# Patient Record
Sex: Male | Born: 1980 | Race: White | Hispanic: No | Smoking: Former smoker
Health system: Southern US, Academic
[De-identification: ages and names within clinical notes are randomized; demographics above are authoritative.]

## PROBLEM LIST (undated history)

## (undated) ENCOUNTER — Encounter (HOSPITAL_BASED_OUTPATIENT_CLINIC_OR_DEPARTMENT_OTHER): Admission: RE | Payer: Self-pay | Source: Ambulatory Visit

## (undated) DIAGNOSIS — K219 Gastro-esophageal reflux disease without esophagitis: Secondary | ICD-10-CM

## (undated) HISTORY — PX: ABDOMINAL SURGERY: SHX537

## (undated) HISTORY — PX: LAPAROSCOPIC CHOLECYSTECTOMY: SUR755

## (undated) HISTORY — PX: HX HERNIA REPAIR: SHX51

## (undated) HISTORY — PX: URETERAL STENT PLACEMENT: SHX822

## (undated) HISTORY — PX: LITHOTRIPSY: SUR834

## (undated) SURGERY — PAIN SERVICE BLOCK INJECTION CERVICAL EPIDURAL WITH IMAGING
Anesthesia: Local (Nurse-Monitored)

---
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
## 2021-11-04 ENCOUNTER — Telehealth (INDEPENDENT_AMBULATORY_CARE_PROVIDER_SITE_OTHER): Payer: Self-pay | Admitting: OTOLARYNGOLOGY

## 2021-11-04 MED ORDER — QNASL 80 MCG/ACTUATION NASAL AEROSOL SPRAY
160.0000 ug | INHALATION_SPRAY | Freq: Every day | NASAL | 11 refills | Status: DC
Start: 2021-11-04 — End: 2022-10-25

## 2021-11-04 MED ORDER — AZELASTINE 137 MCG (0.1 %) NASAL SPRAY
2.0000 | Freq: Every day | NASAL | 11 refills | Status: DC
Start: 2021-11-04 — End: 2022-10-25

## 2021-11-04 MED ORDER — CETIRIZINE 10 MG TABLET
10.0000 mg | ORAL_TABLET | Freq: Every day | ORAL | 3 refills | Status: DC
Start: 2021-11-04 — End: 2022-10-25

## 2021-11-04 MED ORDER — MONTELUKAST 10 MG TABLET
10.0000 mg | ORAL_TABLET | Freq: Every day | ORAL | 3 refills | Status: DC
Start: 2021-11-04 — End: 2022-10-25

## 2021-11-04 NOTE — Telephone Encounter (Signed)
Pt requesting refills for allergy medication

## 2021-11-22 ENCOUNTER — Encounter (INDEPENDENT_AMBULATORY_CARE_PROVIDER_SITE_OTHER): Payer: Self-pay | Admitting: OTOLARYNGOLOGY

## 2021-12-08 ENCOUNTER — Inpatient Hospital Stay (HOSPITAL_COMMUNITY): Payer: MEDICAID

## 2021-12-08 ENCOUNTER — Encounter (HOSPITAL_COMMUNITY): Payer: Self-pay | Admitting: Family

## 2021-12-08 ENCOUNTER — Emergency Department (HOSPITAL_COMMUNITY): Payer: MEDICAID

## 2021-12-08 ENCOUNTER — Emergency Department
Admission: EM | Admit: 2021-12-08 | Discharge: 2021-12-08 | Disposition: A | Discharge status: Home / Self Care | Payer: MEDICAID | Attending: Family | Admitting: Family

## 2021-12-08 ENCOUNTER — Other Ambulatory Visit: Payer: Self-pay

## 2021-12-08 DIAGNOSIS — R52 Pain, unspecified: Secondary | ICD-10-CM

## 2021-12-08 DIAGNOSIS — Z6825 Body mass index (BMI) 25.0-25.9, adult: Secondary | ICD-10-CM

## 2021-12-08 DIAGNOSIS — M546 Pain in thoracic spine: Secondary | ICD-10-CM | POA: Insufficient documentation

## 2021-12-08 DIAGNOSIS — S40211A Abrasion of right shoulder, initial encounter: Secondary | ICD-10-CM | POA: Insufficient documentation

## 2021-12-08 DIAGNOSIS — S1091XA Abrasion of unspecified part of neck, initial encounter: Secondary | ICD-10-CM | POA: Insufficient documentation

## 2021-12-08 DIAGNOSIS — W208XXA Other cause of strike by thrown, projected or falling object, initial encounter: Secondary | ICD-10-CM | POA: Insufficient documentation

## 2021-12-08 DIAGNOSIS — T148XXA Other injury of unspecified body region, initial encounter: Secondary | ICD-10-CM

## 2021-12-08 DIAGNOSIS — M542 Cervicalgia: Secondary | ICD-10-CM | POA: Insufficient documentation

## 2021-12-08 DIAGNOSIS — G44319 Acute post-traumatic headache, not intractable: Secondary | ICD-10-CM | POA: Insufficient documentation

## 2021-12-08 DIAGNOSIS — M19022 Primary osteoarthritis, left elbow: Secondary | ICD-10-CM | POA: Insufficient documentation

## 2021-12-08 MED ORDER — KETOROLAC 60 MG/2 ML INTRAMUSCULAR SOLUTION
INTRAMUSCULAR | Status: AC
Start: 2021-12-08 — End: 2021-12-08
  Filled 2021-12-08: qty 2

## 2021-12-08 MED ORDER — METHOCARBAMOL 750 MG TABLET
750.0000 mg | ORAL_TABLET | Freq: Once | ORAL | Status: AC
Start: 2021-12-08 — End: 2021-12-08
  Administered 2021-12-08: 750 mg via ORAL

## 2021-12-08 MED ORDER — METHOCARBAMOL 750 MG TABLET
ORAL_TABLET | ORAL | Status: AC
Start: 2021-12-08 — End: 2021-12-08
  Filled 2021-12-08: qty 1

## 2021-12-08 MED ORDER — IBUPROFEN 800 MG TABLET
800.0000 mg | ORAL_TABLET | Freq: Three times a day (TID) | ORAL | 0 refills | Status: AC | PRN
Start: 2021-12-08 — End: 2021-12-13

## 2021-12-08 MED ORDER — KETOROLAC 60 MG/2 ML INTRAMUSCULAR SOLUTION
60.0000 mg | INTRAMUSCULAR | Status: AC
Start: 2021-12-08 — End: 2021-12-08
  Administered 2021-12-08: 60 mg via INTRAMUSCULAR

## 2021-12-08 MED ORDER — CYCLOBENZAPRINE 10 MG TABLET
10.0000 mg | ORAL_TABLET | Freq: Three times a day (TID) | ORAL | 0 refills | Status: AC | PRN
Start: 2021-12-08 — End: 2021-12-13

## 2021-12-08 NOTE — ED Provider Notes (Signed)
[place] [facility]  ED Primary Provider Note  History of Present Illness   Chief Complaint   Patient presents with    Medical Screening     Arrival: The patient arrived by Car    [name] is a 41 y.o. male who had concerns including Medical Screening.  Patient presents to the emergency room for evaluation of pain related to injury while at [facility] prior to arrival.  Patient states he was at the lumbar section and boards fell from the shelf landing on him.  States that multiple 2 x 4 by 10s fell on him, staff from [employer] had to get the boards off of him.  Patient complains of headache, neck pain, back pain, wrist pain, pain.  He states his pain is currently a 10/10.  He states his vision was blurred when initial injury occurred.  He denies nausea, vomiting, numbness, and tingling.    Review of Systems   All other systems reviewed and are negative except as noted.    Historical Data   History Reviewed This Encounter: Medical History  Surgical History  Family History  Social History    Physical Exam   ED Triage Vitals [12/08/21 1315]   BP (Non-Invasive) (!) 161/104   Heart Rate 88   Respiratory Rate 16   Temperature 36.6 C (97.8 F)   SpO2 100 %   Weight 81.6 kg (180 lb)   Height 1.778 m (5\' 10" )       Constitutional:  41 y.o. male who appears in no distress. Normal color, no cyanosis.   HENT:   Head: Normocephalic and atraumatic.   Mouth/Throat: Oropharynx is clear and moist.   Eyes: EOMI, PERRL   Neck: Trachea midline. Neck supple.  Cardiovascular: RRR, S1, S2.  No murmurs, rubs or gallops. Intact distal pulses.  Pulmonary/Chest: BS equal bilaterally. No respiratory distress. No wheezes, rales or chest tenderness.   Abdominal: Bowel sounds present and normal. Abdomen soft, no tenderness, no rebound and no guarding.  Back: No midline spinal tenderness, no paraspinal tenderness, no CVA tenderness.           Musculoskeletal: No edema, tenderness or deformity.  Skin: warm and dry. No  rash, erythema, pallor or cyanosis.  Superficial scratch noted to the left neck 2 cm, abrasion noted to the right scapular area, 3 cm in diameter and superficial.  Black contusion 1 cm in diameter noted to the medial aspect of the right bicep.  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   Psychiatric: normal mood and affect. Behavior is normal.   Neurological: Patient keenly alert and responsive, easily able to raise eyebrows, facial muscles/expressions symmetric, speaking in fluent sentences, moving all extremities equally and fully, normal gait  Patient Data   Labs Ordered/Reviewed - No data to display  XR THORACIC SPINE 2 VIEW   Final Result by Edi, Radresults In (07/26 1442)   NEGATIVE THORACIC SPINE X-RAYS.            Radiologist location ID: [id number]         CT CERVICAL SPINE WO IV CONTRAST   Final Result by Edi, Radresults In (07/26 1359)   NO ACUTE CERVICAL FRACTURE         One or more dose reduction techniques were used (e.g., Automated exposure control, adjustment of the mA and/or kV according to patient size, use of iterative reconstruction technique).         Radiologist location ID: [id number]  CT BRAIN WO IV CONTRAST   Final Result by Edi, Radresults In (07/26 1358)   NO ACUTE FINDINGS         One or more dose reduction techniques were used (e.g., Automated exposure control, adjustment of the mA and/or kV according to patient size, use of iterative reconstruction technique).         Radiologist location ID: [id number]         XR HAND LEFT   Final Result by Edi, Radresults In (07/26 1341)   NO ACUTE FRACTURE OR DISLOCATION.                      Radiologist location ID: [id number]         XR ELBOW LEFT   Final Result by Edi, Radresults In (07/26 1348)   MILD DEGENERATIVE ARTHROSIS. NO ACUTE FINDINGS.                Radiologist location ID: [id number]         XR HIPS BILATERAL W PELVIS 3-4 VIEWS   Final Result by Edi, Radresults In (07/26 1338)   NEGATIVE BILATERAL HIP SERIES.                 Radiologist location ID: [id number]           Medical Decision Making        Medical Decision Making  Will discharge patient home due to no significant/urgent findings requiring admission.  Patient has no fractures noted on x-rays/CT.  CT of the brain shows no bleeding.  Neurologically the patient is intact.  He has mild abrasions noted to the scapula as well as a scratch to the neck.  Patient was giving ketorolac as well as a muscle relaxer here in the ER.  We will place him on muscle relaxers outpatient.  Patient was instructed to use ice the 1st 24-48 hours 5 to 6 times a day for 15-20 minutes.  He is to alternate heat and ice after that time.  Patient to follow up with his regular doctor in 2-3 days and return to the ER for any problems or worsening.  Significant other at bedside, she was notified to monitor for changes in mental status, uneven pupils, numbness or tingling and to return to the ER if needed.  Both verbalize understanding.  He has remained alert and oriented in the ER.    Amount and/or Complexity of Data Reviewed  Labs: ordered. Decision-making details documented in ED Course.  Radiology: ordered. Decision-making details documented in ED Course.    Risk  Prescription drug management.    Critical Care  Total time providing critical care: 0 minutes           Medications Administered in the ED   ketorolac (TORADOL) 60mg /2 mL IM injection (60 mg IntraMUSCULAR Given 12/08/21 1437)   methocarbamol (ROBAXIN) tablet (750 mg Oral Given 12/08/21 1437)     Clinical Impression   Acute post-traumatic headache, not intractable (Primary)   Neck pain   Acute bilateral thoracic back pain   Generalized pain   Abrasion of right shoulder   Scratch mark - left neck       Disposition: Discharged    Current Discharge Medication List        START taking these medications    Details   cyclobenzaprine (FLEXERIL) 10 mg Oral Tablet Take 1 Tablet (10 mg total) by mouth Three times a day as needed for Muscle spasms for up to  5  days  Qty: 15 Tablet, Refills: 0      Ibuprofen (MOTRIN) 800 mg Oral Tablet Take 1 Tablet (800 mg total) by mouth Three times a day as needed for Pain for up to 5 days  Qty: 15 Tablet, Refills: 0

## 2021-12-08 NOTE — ED Nurses Note (Signed)
Patient discharged home with family.  AVS reviewed with patient.  A written copy of the AVS and discharge instructions was given to the patient.  Questions sufficiently answered as needed.  Patient encouraged to follow up with PCP as indicated.  In the event of an emergency, patient instructed to call 911 or go to the nearest emergency room.

## 2021-12-08 NOTE — Discharge Instructions (Signed)
FOLLOW UP WITH THE PRIMARY CARE PROVIDER AS SOON AS POSSIBLE BUT NO LATER THAN 3 DAYS NOW.  IF NO PRIMARY CARE PROVIDER EXISTS, THEN THE PATIENT IS INSTRUCTED TO ESTABLISH CARE WITH A PRIMARY CARE PROVIDER. FOLLOW-UP WITH ANY SPECIALIST PROVIDER AS INDICATED AS SOON AS POSSIBLE BUT NO LATER THAN 3 DAYS.  NOTIFY THE PRIMARY CARE PROVIDER THAT YOU WERE IN THE EMERGENCY DEPARTMENT WITHIN 24 HOURS OF DISCHARGE TO FOLLOW-UP ON YOUR RESULTS AND/OR TREATMENTS.  RETURN TO THE EMERGENCY DEPARTMENT IMMEDIATELY IF NEEDED, NO BETTER, WORSE, NEW SYMPTOMS ARISE, OR YOU CANNOT FOLLOW-UP WITH YOUR PRIMARY CARE PROVIDER IN THE PRESCRIBED TIMEFRAME.

## 2021-12-08 NOTE — ED Triage Notes (Signed)
STATES HAD LUMBER FALL ON HIM AT [employer]. NEG LOC. C/O HA, L ELBOW, L KNEE, AND BACK PAIN. NO OBVIOUS INJURY.

## 2021-12-08 NOTE — ED APP Handoff Note (Signed)
[place] [facility]  Emergency Department  Provider in Triage Note    Name: [name]  Age: 41 y.o.  Gender: male     Subjective:   [name] is a 41 y.o. male who presents with complaint of Medical Screening  .  Pt had lumbar fall on him at [facility]. He reports headache, left elbow pain. This happened around an hour PTA    Objective:   Filed Vitals:    12/08/21 1315   BP: (!) 161/104   Pulse: 88   Resp: 16   Temp: 36.6 C (97.8 F)   SpO2: 100%      Focused Physical Exam shows wnwd male pt.     Assessment:  A medical screening exam was completed.  This patient is a 41 y.o. male with initial findings showing left elbow pain.    Plan:  Please see initial orders and work-up below.  This is to be continued with full evaluation in the main Emergency Department.     No current facility-administered medications for this encounter.     No results found for this or any previous visit (from the past 24 hour(s)).     [place], PA-C  12/08/2021, 13:13

## 2021-12-09 ENCOUNTER — Emergency Department (HOSPITAL_COMMUNITY): Payer: MEDICAID

## 2021-12-09 ENCOUNTER — Emergency Department
Admission: EM | Admit: 2021-12-09 | Discharge: 2021-12-09 | Disposition: A | Discharge status: Home / Self Care | Payer: MEDICAID | Attending: Family | Admitting: Family

## 2021-12-09 ENCOUNTER — Encounter (HOSPITAL_COMMUNITY): Payer: Self-pay | Admitting: Family

## 2021-12-09 ENCOUNTER — Other Ambulatory Visit: Payer: Self-pay

## 2021-12-09 DIAGNOSIS — X58XXXA Exposure to other specified factors, initial encounter: Secondary | ICD-10-CM | POA: Insufficient documentation

## 2021-12-09 DIAGNOSIS — Z6825 Body mass index (BMI) 25.0-25.9, adult: Secondary | ICD-10-CM

## 2021-12-09 DIAGNOSIS — R519 Headache, unspecified: Secondary | ICD-10-CM | POA: Insufficient documentation

## 2021-12-09 DIAGNOSIS — S161XXA Strain of muscle, fascia and tendon at neck level, initial encounter: Secondary | ICD-10-CM | POA: Insufficient documentation

## 2021-12-09 HISTORY — DX: Gastro-esophageal reflux disease without esophagitis: K21.9

## 2021-12-09 MED ORDER — ONDANSETRON 4 MG DISINTEGRATING TABLET
4.0000 mg | ORAL_TABLET | ORAL | Status: AC
Start: 2021-12-09 — End: 2021-12-09
  Administered 2021-12-09: 4 mg via ORAL

## 2021-12-09 MED ORDER — KETOROLAC 10 MG TABLET
10.0000 mg | ORAL_TABLET | Freq: Four times a day (QID) | ORAL | 0 refills | Status: AC | PRN
Start: 2021-12-09 — End: 2021-12-19

## 2021-12-09 MED ORDER — ONDANSETRON 4 MG DISINTEGRATING TABLET
ORAL_TABLET | ORAL | Status: AC
Start: 2021-12-09 — End: 2021-12-09
  Filled 2021-12-09: qty 1

## 2021-12-09 MED ORDER — ONDANSETRON 4 MG DISINTEGRATING TABLET
4.0000 mg | ORAL_TABLET | Freq: Three times a day (TID) | ORAL | 0 refills | Status: AC | PRN
Start: 2021-12-09 — End: 2021-12-13

## 2021-12-09 MED ORDER — KETOROLAC 60 MG/2 ML INTRAMUSCULAR SOLUTION
INTRAMUSCULAR | Status: AC
Start: 2021-12-09 — End: 2021-12-09
  Filled 2021-12-09: qty 2

## 2021-12-09 MED ORDER — KETOROLAC 60 MG/2 ML INTRAMUSCULAR SOLUTION
60.0000 mg | INTRAMUSCULAR | Status: AC
Start: 2021-12-09 — End: 2021-12-09
  Administered 2021-12-09: 60 mg via INTRAMUSCULAR

## 2021-12-09 NOTE — ED APP Handoff Note (Signed)
[place] [facility]  Emergency Department  Handoff Note    Care/report received from Juanda Crumble NP @ [phone]  Per report:  [name] is a 41 y.o. male who had concerns including Headache and Neck Pain.     Pending labs/imaging/consults:  CT of head  Plan:  CT of head negative recommended symptomatic management follow up with primary care    Course:      Following the history, physical exam, and ED workup, the patient was deemed stable and suitable for discharge. The patient/caregiver was advised to return to the ED for any new or worsening symptoms. Discharge medications, and follow-up instructions were discussed with the patient/caregiver in detail, who verbalizes understanding. The patient/caregiver is in agreement and is comfortable with the plan of care.    Disposition: Discharged         Current Discharge Medication List        CONTINUE these medications - NO CHANGES were made during your visit.        Details   azelastine 137 mcg (0.1 %) Aerosol, Spray  Commonly known as: ASTELIN   2 Sprays, Each Nostril, DAILY, Use in each nostril as directed  Qty: 30 mL  Refills: 11     cetirizine 10 mg Tablet  Commonly known as: ZyrTEC   10 mg, Oral, DAILY  Qty: 90 Tablet  Refills: 3     cyclobenzaprine 10 mg Tablet  Commonly known as: FLEXERIL   10 mg, Oral, 3 TIMES DAILY PRN  Qty: 15 Tablet  Refills: 0     Ibuprofen 800 mg Tablet  Commonly known as: MOTRIN   800 mg, Oral, 3 TIMES DAILY PRN  Qty: 15 Tablet  Refills: 0     montelukast 10 mg Tablet  Commonly known as: SINGULAIR   10 mg, Oral, DAILY  Qty: 90 Tablet  Refills: 3     QNASL 80 mcg/actuation HFA Aerosol Inhaler  Generic drug: beclomethasone dipropionate   160 mcg, Nasal, DAILY  Qty: 10.6 g  Refills: 11            Follow up:   No follow-up provider specified.    Clinical Impression   Headache (Primary)   Cervical strain         [facility], PA-C

## 2021-12-09 NOTE — ED Nurses Note (Addendum)
Patient to ER 6 c/o a headache and neck pain after having lumber fall on top of him yesterday at [employer]. Patient was here in the ER yesterday as well. Patient reports that the headache has got worse since yesterday. Also, reports nausea. Patient is awake, A&O x 4. NAD noted. Call light within reach. Will continue to monitor.

## 2021-12-09 NOTE — ED Provider Notes (Signed)
[place] [facility]  ED Primary Provider Note  History of Present Illness   Chief Complaint   Patient presents with    Headache    Neck Pain     Arrival: The patient arrived by Car    [name] is a 41 y.o. male who had concerns including Headache and Neck Pain.  41 year old male presents emergency room for worsening headache, he states it feels like the worse sinus headache of his life.  Patient states he has some nausea.  He also notes ongoing neck discomfort.  Patient denies numbness, tingling, loss of consciousness.  Patient was seen on 12/08/2021 after he stated lumbar fell from the shell landing on him while at [facility].  Patient did have generalized pain and now it feels he has tenderness to his scalp area and continues to have some neck and back pain.  He states he did take the Flexeril but was not able to take them today.  He states he did take ibuprofen and attempted to work today but the pain has continued and is worsening in the head.    Review of Systems   All other systems reviewed and are negative except as noted.    Historical Data   History Reviewed This Encounter: Medical History  Surgical History  Family History  Social History    Physical Exam   ED Triage Vitals [12/09/21 1541]   BP (Non-Invasive) (!) 164/97   Pulse    Respiratory Rate 18   Temperature 36.6 C (97.8 F)   SpO2 98 %   Weight 81.6 kg (180 lb)   Height 1.778 m (5\' 10" )       Constitutional:  41 y.o. male who appears in no distress. Normal color, no cyanosis.   HENT:   Head: Normocephalic and atraumatic. Tenderness noted to scalp.  Mouth/Throat: Oropharynx is clear and moist.   Eyes: EOMI, PERRL   Neck: Trachea midline. Neck supple.  Cardiovascular: RRR, S1, S2. No murmurs, rubs or gallops. Intact distal pulses.  Pulmonary/Chest: BS clear and equal bilaterally. No respiratory distress. No wheezes, rales or chest tenderness.   Abdominal: Bowel sounds present and normal. Abdomen soft, no tenderness, no  rebound and no guarding.  Back: No midline spinal tenderness, no paraspinal tenderness, no CVA tenderness.           Musculoskeletal: No edema, tenderness or deformity. Tenderness noted generalized back.   Skin: warm and dry. No rash, erythema, pallor or cyanosis. ? Contusion noted mid scapular area blue in color.   Psychiatric: normal mood and affect. Behavior is normal.   Neurological: Patient keenly alert and responsive, easily able to raise eyebrows, facial muscles/expressions symmetric, speaking in fluent sentences, moving all extremities equally and fully, normal gait  Patient Data   Labs Ordered/Reviewed - No data to display  No orders to display     Medical Decision Making        Medical Decision Making  Pt care transferred to [facility] pending Ct head.     Amount and/or Complexity of Data Reviewed  Radiology: ordered.    Risk  Prescription drug management.             Medications Administered in the ED   ketorolac (TORADOL) 60mg /2 mL IM injection (60 mg IntraMUSCULAR Given 12/09/21 1616)   ondansetron (ZOFRAN ODT) rapid dissolve tablet (4 mg Oral Given 12/09/21 1616)     Clinical Impression   Headache (Primary)       Disposition:  Data Unavailable

## 2021-12-09 NOTE — Discharge Instructions (Signed)
CT of the head does not demonstrate any bleeds.  Recommend rest pushing fluids taking muscle relaxers as needed.  Apply warm compresses to the neck with gentle stretching.  Follow up with the primary care to make sure your getting better.  If new or worsening symptoms return to the emergency department

## 2021-12-09 NOTE — ED Triage Notes (Addendum)
Complaining of headache and posterior neck pain.  Was seen and evaluated in ER yesterday after lumber fell on him at a local store.  Reports worsening headache.  Nausea unrelieved by Zofran at home.

## 2021-12-09 NOTE — ED Nurses Note (Signed)
Nurse in to discharge patient. Patient states day shift provider was "going to send me home with Toradol". Pt did not at this time have prescription. States he "knew this would happen". This nurse offered to speak with night shift provider. Pt states "I don't have time for that." Patient request to speak with nursing supervisor or charge nurse. Charge nurse in to speak with patient. This nurse made provider aware.

## 2022-01-23 ENCOUNTER — Other Ambulatory Visit (INDEPENDENT_AMBULATORY_CARE_PROVIDER_SITE_OTHER): Payer: Self-pay | Admitting: Family

## 2022-01-24 NOTE — Telephone Encounter (Signed)
Needs appt before any further refills

## 2022-04-05 ENCOUNTER — Other Ambulatory Visit (HOSPITAL_COMMUNITY): Payer: Self-pay | Admitting: Family

## 2022-04-05 ENCOUNTER — Inpatient Hospital Stay
Admission: RE | Admit: 2022-04-05 | Discharge: 2022-04-05 | Disposition: A | Discharge status: Home / Self Care | Payer: MEDICAID | Source: Ambulatory Visit | Attending: Family | Admitting: Family

## 2022-04-05 ENCOUNTER — Other Ambulatory Visit: Payer: Self-pay

## 2022-04-05 DIAGNOSIS — R079 Chest pain, unspecified: Secondary | ICD-10-CM | POA: Insufficient documentation

## 2022-04-05 DIAGNOSIS — T1490XA Injury, unspecified, initial encounter: Secondary | ICD-10-CM

## 2022-05-27 ENCOUNTER — Other Ambulatory Visit: Payer: Self-pay

## 2022-05-27 ENCOUNTER — Emergency Department (HOSPITAL_COMMUNITY): Payer: MEDICAID

## 2022-05-27 ENCOUNTER — Encounter (HOSPITAL_COMMUNITY): Payer: Self-pay

## 2022-05-27 ENCOUNTER — Emergency Department
Admission: EM | Admit: 2022-05-27 | Discharge: 2022-05-27 | Disposition: A | Discharge status: Home / Self Care | Payer: MEDICAID | Source: Home / Self Care | Attending: Emergency Medicine | Admitting: Emergency Medicine

## 2022-05-27 DIAGNOSIS — S060XAA Concussion with loss of consciousness status unknown, initial encounter: Secondary | ICD-10-CM | POA: Insufficient documentation

## 2022-05-27 DIAGNOSIS — S060X1A Concussion with loss of consciousness of 30 minutes or less, initial encounter: Secondary | ICD-10-CM

## 2022-05-27 DIAGNOSIS — W11XXXA Fall on and from ladder, initial encounter: Secondary | ICD-10-CM | POA: Insufficient documentation

## 2022-05-27 MED ORDER — DIPHENHYDRAMINE 50 MG/ML INJECTION SOLUTION
INTRAMUSCULAR | Status: AC
Start: 2022-05-27 — End: 2022-05-27
  Filled 2022-05-27: qty 1

## 2022-05-27 MED ORDER — DEXAMETHASONE SODIUM PHOSPHATE (PF) 10 MG/ML INJECTION SOLUTION
INTRAMUSCULAR | Status: AC
Start: 2022-05-27 — End: 2022-05-27
  Filled 2022-05-27: qty 1

## 2022-05-27 MED ORDER — ACETAMINOPHEN 325 MG TABLET
975.0000 mg | ORAL_TABLET | ORAL | Status: AC
Start: 2022-05-27 — End: 2022-05-27
  Administered 2022-05-27: 975 mg via ORAL

## 2022-05-27 MED ORDER — DIPHENHYDRAMINE 50 MG/ML INJECTION SOLUTION
12.5000 mg | INTRAMUSCULAR | Status: AC
Start: 2022-05-27 — End: 2022-05-27
  Administered 2022-05-27: 12.5 mg via INTRAVENOUS

## 2022-05-27 MED ORDER — PROCHLORPERAZINE EDISYLATE 10 MG/2 ML (5 MG/ML) INJECTION SOLUTION
INTRAMUSCULAR | Status: AC
Start: 2022-05-27 — End: 2022-05-27
  Filled 2022-05-27: qty 2

## 2022-05-27 MED ORDER — NAPROXEN 500 MG TABLET
500.0000 mg | ORAL_TABLET | Freq: Two times a day (BID) | ORAL | 0 refills | Status: DC | PRN
Start: 2022-05-27 — End: 2022-10-25

## 2022-05-27 MED ORDER — ACETAMINOPHEN 325 MG TABLET
ORAL_TABLET | ORAL | Status: AC
Start: 2022-05-27 — End: 2022-05-27
  Filled 2022-05-27: qty 3

## 2022-05-27 MED ORDER — ONDANSETRON 4 MG DISINTEGRATING TABLET
4.0000 mg | ORAL_TABLET | Freq: Three times a day (TID) | ORAL | 0 refills | Status: AC | PRN
Start: 2022-05-27 — End: ?

## 2022-05-27 MED ORDER — DEXAMETHASONE SODIUM PHOSPHATE (PF) 10 MG/ML INJECTION SOLUTION
10.0000 mg | INTRAMUSCULAR | Status: AC
Start: 2022-05-27 — End: 2022-05-27
  Administered 2022-05-27: 10 mg via INTRAVENOUS

## 2022-05-27 MED ORDER — PROCHLORPERAZINE EDISYLATE 10 MG/2 ML (5 MG/ML) INJECTION SOLUTION
10.0000 mg | INTRAMUSCULAR | Status: AC
Start: 2022-05-27 — End: 2022-05-27
  Administered 2022-05-27: 10 mg via INTRAVENOUS

## 2022-05-27 NOTE — Discharge Instructions (Addendum)
Thank you for allowing [place] to be part of your care.    Please discuss all medications with your pharmacist to ensure there are no concerns of interactions.    Please ensure all questions or concerns are addressed prior to leaving the hospital. We want to make sure your concerns are addressed to make sure you are as safe and healthy as possible. By leaving the hospital, it is understood you are in agreement with your treatment plan.    You may have received sedating medication during your visit. Please discuss this with your discharging provider nurse as you may not be able to operate machines while the medication is in your system, or while you are taking any potentially sedating prescriptions.    Please call the hospital medical records office for a copy of your finalized results, and review them with a primary care physician, for any findings needing further attention.    If you feel your situation worsens, or does not get better in 48 hours, please see a physician for evaluation.    We encourage you to see your regular doctor as soon as possible to let them know you were seen in the emergency department. They may want to do further testing. If you do not have a doctor, please feel free to call the hospital, and ask for contact information of accepting providers. Please also discuss your vaccinations, and ensure all are up to date.    You may use this document to take today off work or school.    Please participate in brain rest. This includes no electronic devices, no school/work until cleared by your primary provider. You may return to work/school/activity once cleared by your primary provider or neurologist. Please discuss over the counter medication with your pharmacist.    Do not use other NSAID'S while taking Naproxen. Please discuss this with your pharmacist.

## 2022-05-27 NOTE — ED Provider Notes (Signed)
[facility]  ED Primary Provider Note  Patient Name: [name]  Patient Age: 42 y.o.  Date of Birth: [birth year]    Chief Complaint: Fall        History of Present Illness       [name] is a 42 y.o. male who had concerns including Fall.  This patient is a 41 year old male who presents after fall 2 days prior.  [name] states he was on 8 ft ladder, fell backwards, struck the occiput of his head, had loss of consciousness for proximally 3 minutes.  He presents today for continual headache, that is described as a sinus headache in nature.        Review of Systems     No other overt Review of Systems are noted to be positive except noted in the HPI.      Historical Data   History Reviewed This Encounter: Medical History  Surgical History  Family History  Social History      Physical Exam   ED Triage Vitals [05/27/22 0809]   BP (Non-Invasive) (!) 148/93   Heart Rate 72   Respiratory Rate 18   Temperature 36.7 C (98.1 F)   SpO2 100 %   Weight 81.6 kg (180 lb)   Height 1.778 m (5\' 10" )         Nursing notes reviewed for what could be assessed. Past Medical, Surgical, and Social history reviewed for what has been completed.    Constitutional: NAD. Well-Developed. Well Nourished.  Head: Normocephalic, atraumatic.  Mouth/Throat:  Symmetric facial movement, uvula midline.  Eyes: EOM grossly intact, conjunctiva normal.  Neck: Supple  Cardiovascular: Regular Rate and Rhythm, extremities well perfused.  Pulmonary/Chest: No respiratory distress. Lungs are symmetric to auscultation bilaterally.  Abdominal: Soft, non-tender, non-distended. Non peritoneal, no rebound, no guarding.  MSK: No Lower Extremity Edema.  Skin: Warm, dry, and intact  Neuro: Appropriate, CN II-XII grossly intact. Gait not ataxic.  No nystagmus on extraocular motion testing. PERRLA.  Psych: Pleasant              Procedures      Patient Data   Labs Ordered/Reviewed - No data to display    CT BRAIN WO IV  CONTRAST   Final Result by Edi, Radresults In (01/12 0853)   NORMAL NONCONTRAST HEAD CT.         One or more dose reduction techniques were used (e.g., Automated exposure control, adjustment of the mA and/or kV according to patient size, use of iterative reconstruction technique).         Radiologist location ID: [id number]         CT CERVICAL SPINE WO IV CONTRAST   Final Result by Edi, Radresults In (01/12 0852)   NO ACUTE CERVICAL FRACTURE         One or more dose reduction techniques were used (e.g., Automated exposure control, adjustment of the mA and/or kV according to patient size, use of iterative reconstruction technique).         Radiologist location ID: [place] Decision Making          Medical Decision Making        Studies Assessed and/or Ordered:  Radiology          MDM Narrative:  This patient is a 42 year old male who presents with a continual headache after falling 8 ft in days prior.  Differential includes intracranial hemorrhage, skull fracture, concussion.  Patient underwent CT imaging with no red flag findings of acute pathology.  Patient was given medication to assist with his headache.  Patient has no acute neurological deficits aside from his complaint of headache.  Return precautions given.             Medications Administered in the ED   prochlorperazine (COMPAZINE) 5 mg/mL injection (has no administration in time range)     And   diphenhydrAMINE (BENADRYL) 50 mg/mL injection (has no administration in time range)   acetaminophen (TYLENOL) tablet (has no administration in time range)   dexAMETHasone (PF) 10 mg/mL injection (has no administration in time range)       Following the history, physical exam, and ED workup, the patient was deemed stable and suitable for discharge. The patient/caregiver was advised to return to the ED for any new or worsening symptoms. Discharge medications, and follow-up instructions were discussed with the patient/caregiver in detail, who  verbalizes understanding. The patient/caregiver is in agreement and is comfortable with the plan of care.    Disposition: Discharged         Current Discharge Medication List        START taking these medications.        Details   naproxen 500 mg Tablet  Commonly known as: NAPROSYN   500 mg, Oral, EVERY 12 HOURS PRN  Qty: 10 Tablet  Refills: 0     ondansetron 4 mg Tablet, Rapid Dissolve  Commonly known as: ZOFRAN ODT   4 mg, Oral, EVERY 8 HOURS PRN  Qty: 10 Tablet  Refills: 0            CONTINUE these medications - NO CHANGES were made during your visit.        Details   azelastine 137 mcg (0.1 %) Aerosol, Spray  Commonly known as: ASTELIN   2 Sprays, Each Nostril, DAILY, Use in each nostril as directed  Qty: 30 mL  Refills: 11     cetirizine 10 mg Tablet  Commonly known as: ZyrTEC   10 mg, Oral, DAILY  Qty: 90 Tablet  Refills: 3     montelukast 10 mg Tablet  Commonly known as: SINGULAIR   10 mg, Oral, DAILY  Qty: 90 Tablet  Refills: 3     QNASL 80 mcg/actuation HFA Aerosol Inhaler  Generic drug: beclomethasone dipropionate   160 mcg, Nasal, DAILY  Qty: 10.6 g  Refills: 11            Follow up:   No follow-up provider specified.             Clinical Impression   Concussion (Primary)         Current Discharge Medication List        START taking these medications    Details   naproxen (NAPROSYN) 500 mg Oral Tablet Take 1 Tablet (500 mg total) by mouth Every 12 hours as needed for Pain  Qty: 10 Tablet, Refills: 0      ondansetron (ZOFRAN ODT) 4 mg Oral Tablet, Rapid Dissolve Take 1 Tablet (4 mg total) by mouth Every 8 hours as needed for Nausea/Vomiting  Qty: 10 Tablet, Refills: 0               /R. Baldo Daub, MD, [facility]  Department of Emergency Medicine  [facility]

## 2022-05-27 NOTE — ED Triage Notes (Signed)
Fell off ladder, was approx 8 feet in the air onto head on Wednesday. After security camera review, postive LOC of approx 3 min. Has knot on head "with clear stuff coming out of it" and "has the worst sinus headache".

## 2022-07-26 ENCOUNTER — Other Ambulatory Visit (HOSPITAL_COMMUNITY): Payer: Self-pay

## 2022-07-26 DIAGNOSIS — S59909A Unspecified injury of unspecified elbow, initial encounter: Secondary | ICD-10-CM

## 2022-07-27 ENCOUNTER — Encounter (HOSPITAL_PSYCHIATRIC): Payer: Self-pay | Admitting: PHYSICIAN ASSISTANT

## 2022-07-27 ENCOUNTER — Other Ambulatory Visit (HOSPITAL_COMMUNITY): Payer: Self-pay

## 2022-07-27 ENCOUNTER — Ambulatory Visit: Payer: MEDICAID | Attending: PHYSICIAN ASSISTANT | Admitting: PHYSICIAN ASSISTANT

## 2022-07-27 ENCOUNTER — Other Ambulatory Visit: Payer: Self-pay

## 2022-07-27 VITALS — BP 144/88 | HR 56 | Resp 18 | Ht 70.0 in | Wt 180.0 lb

## 2022-07-27 DIAGNOSIS — Z79899 Other long term (current) drug therapy: Secondary | ICD-10-CM | POA: Insufficient documentation

## 2022-07-27 DIAGNOSIS — F411 Generalized anxiety disorder: Secondary | ICD-10-CM | POA: Insufficient documentation

## 2022-07-27 DIAGNOSIS — Z658 Other specified problems related to psychosocial circumstances: Secondary | ICD-10-CM | POA: Insufficient documentation

## 2022-07-27 DIAGNOSIS — F332 Major depressive disorder, recurrent severe without psychotic features: Secondary | ICD-10-CM | POA: Insufficient documentation

## 2022-07-27 DIAGNOSIS — R4584 Anhedonia: Secondary | ICD-10-CM | POA: Insufficient documentation

## 2022-07-27 IMAGING — MR MRI CERVICAL SPINE WITHOUT CONTRAST
4 of 5 series · 23 of 48 positions shown · IV contrast (gadolinium)
Comparison: None available.

﻿EXAM:  53717   MRI CERVICAL SPINE WITHOUT CONTRAST
INDICATION: Neck pain. Left shoulder pain. Trauma 3 months ago.  Heavy object fell on patient's neck. No prior surgery.
TECHNIQUE: Multiplanar, multisequential MRI of the C-spine was performed without gadolinium contrast.

[Series 5: T2 · sagittal · 3.0mm · 0.75mm/px · 8 of 13 slices shown (1 of 2)]
[im 1/13]
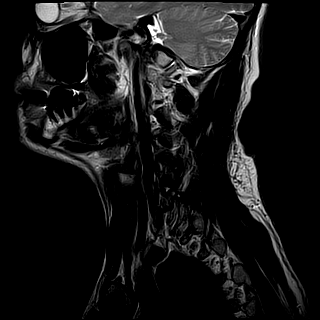
[im 2/13]
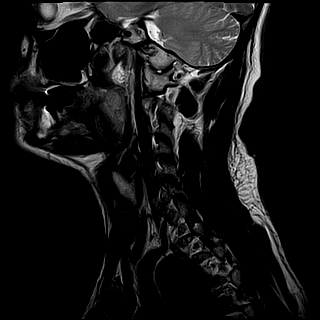
[im 4/13]
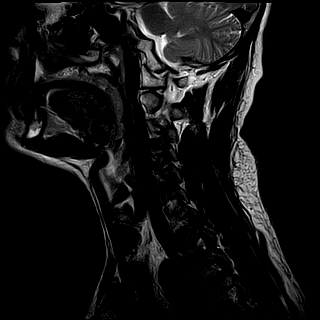
[im 6/13]
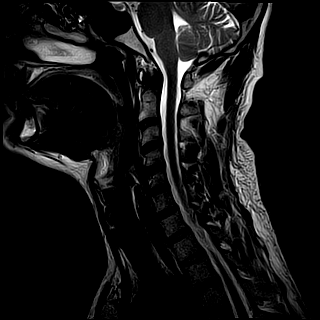
[im 7/13]
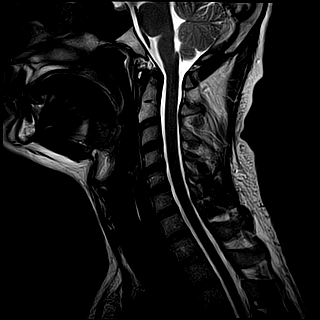
[im 9/13]
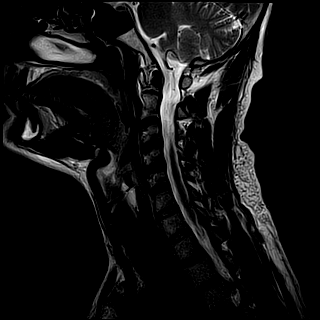
[im 11/13]
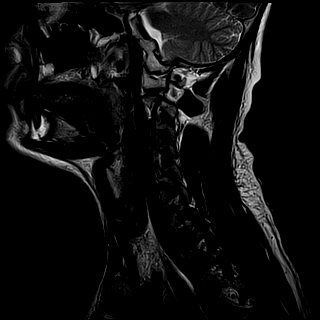
[im 13/13]
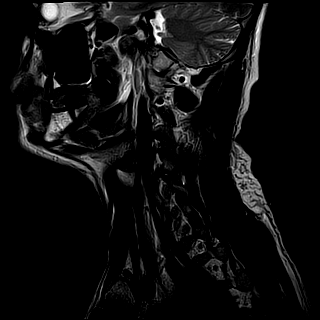

[Series 6: T1 · sagittal · 3.0mm · 0.47mm/px · 3 of 13 slices shown]
[im 2/13]
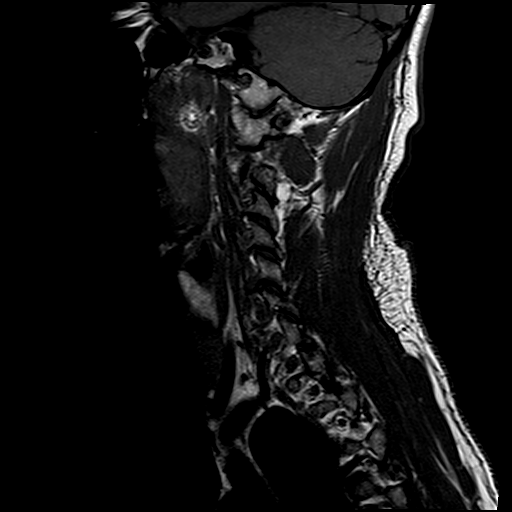
[im 7/13]
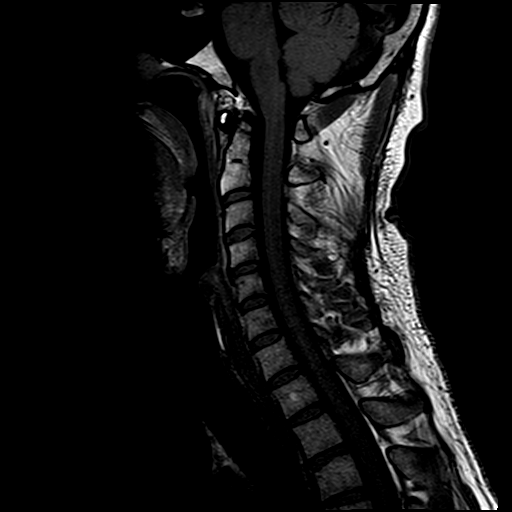
[im 11/13]
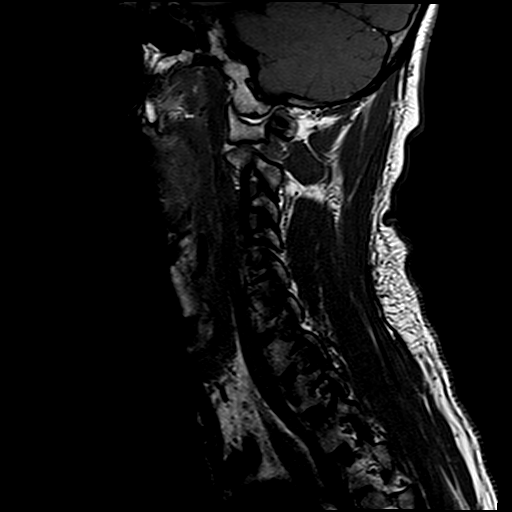

[Series 7: STIR · sagittal · 3.0mm · 0.47mm/px · 3 of 13 slices shown]
[im 2/13]
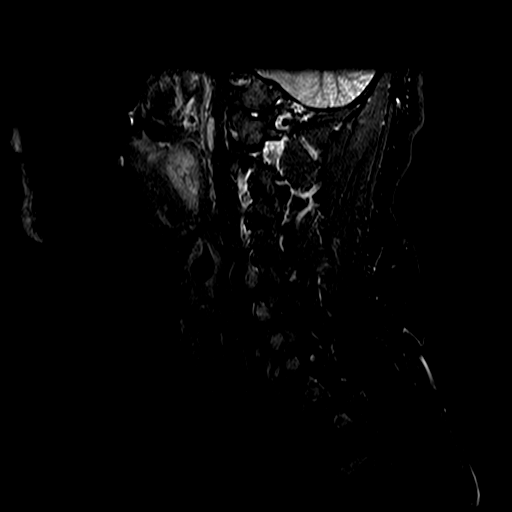
[im 7/13]
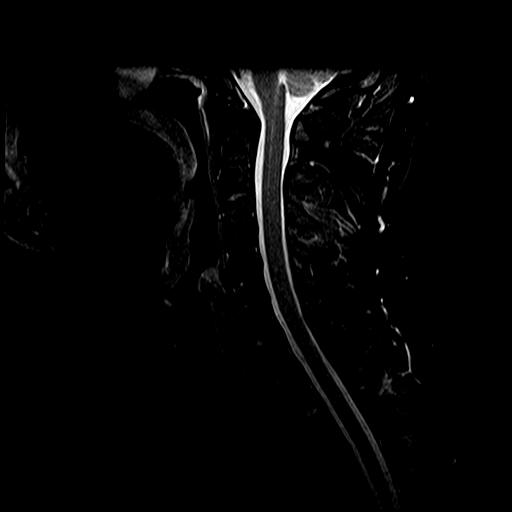
[im 11/13]
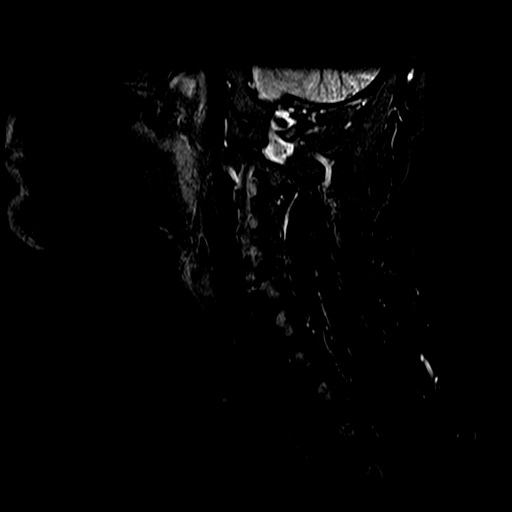

[Series 8: T2 · axial · 3.0mm · 0.39mm/px · z∈[-87,+10]mm · 9 of 18 slices shown (2 of 2)]
[im 1/18]
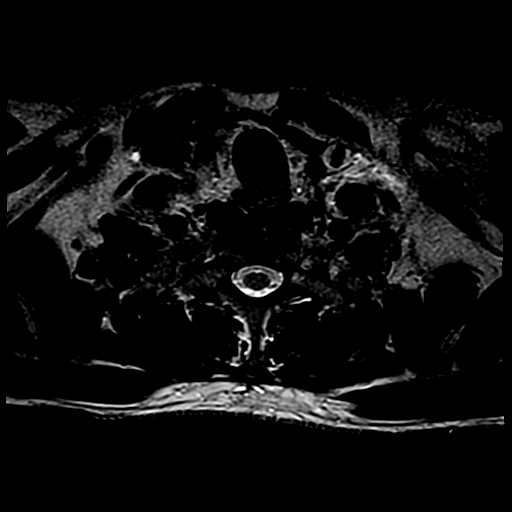
[im 4/18]
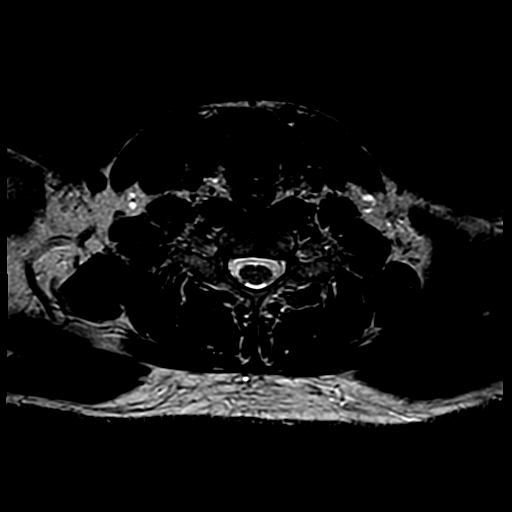
[im 5/18]
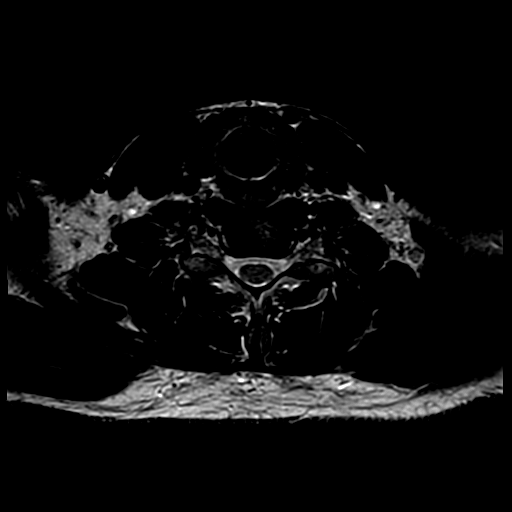
[im 8/18]
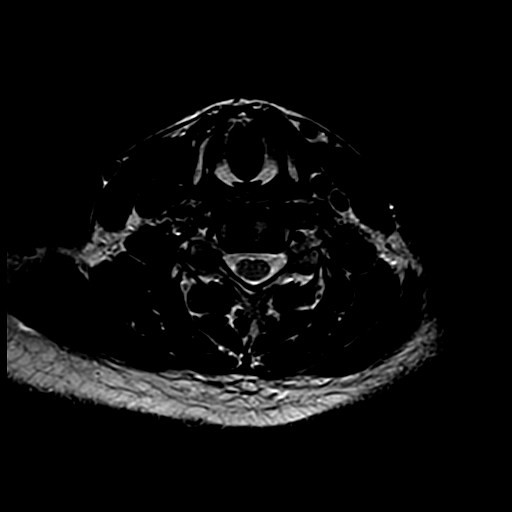
[im 10/18]
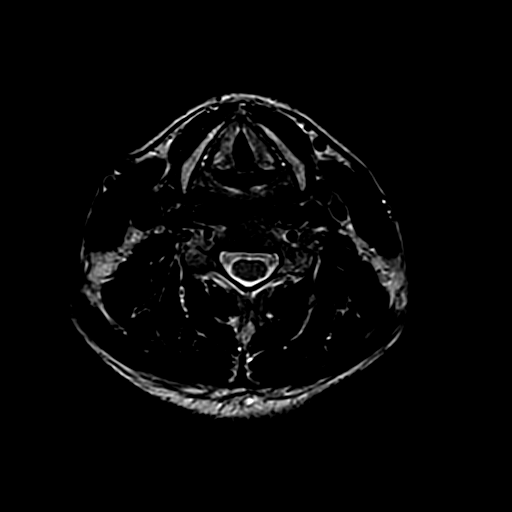
[im 13/18]
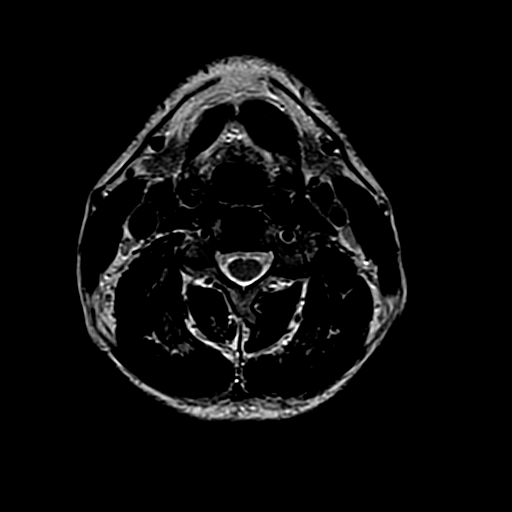
[im 14/18]
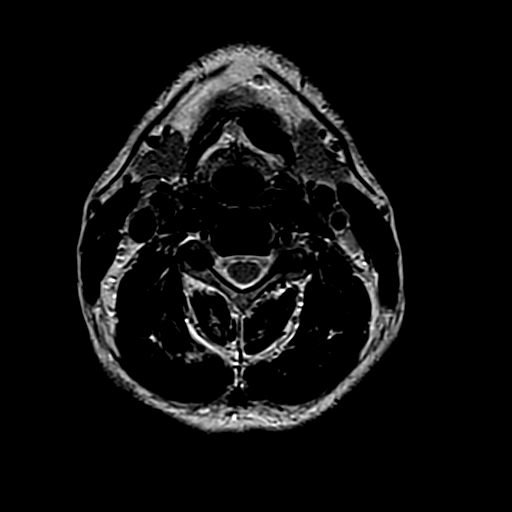
[im 16/18]
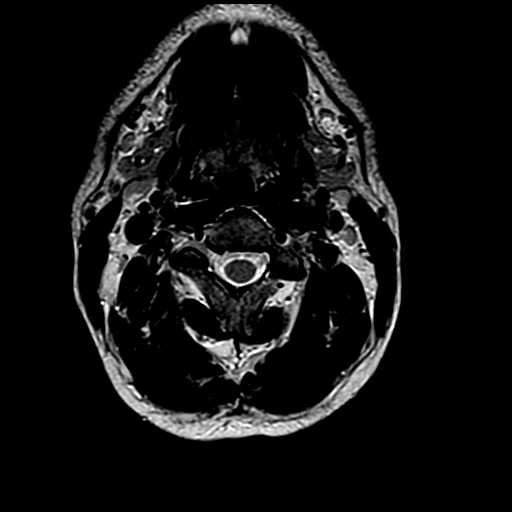
[im 18/18]
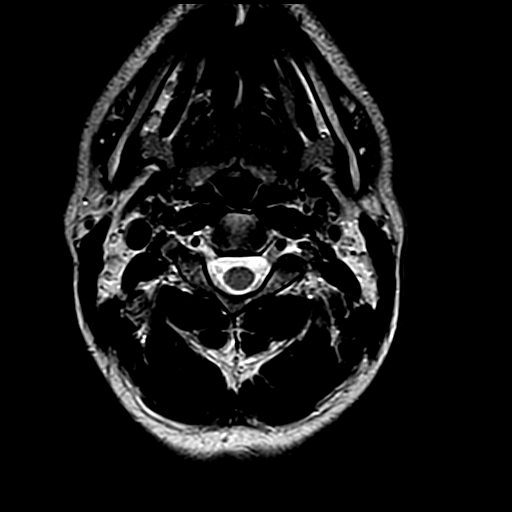

[23 of 48 positions shown; findings below may reference images not displayed]

FINDINGS: No acute bony lesions of cervical vertebrae and upper thoracic vertebrae up to T3.

Foramen magnum structures are normal in the sagittal projection. 

At C2-3 level, no focal disc lesions are seen. 

At C3-4 level, no focal disc lesions are seen. 

At C4-5 level, mild degenerative disc disease with bulging annulus is causing mild left foraminal narrowing and minimal compromise of thecal sac. 

At C5-6 level, degenerative disc disease with bulging annulus is causing mild biforaminal narrowing, left more than the right.

At C6-7 level, no focal disc lesions are seen.

C7-T1 disc shows no focal lesions.

Cervical spinal cord shows no focal pathology.  Paravertebral soft tissues are unremarkable.
IMPRESSION: 1. No acute or focal bone changes of cervical vertebrae. 

2. At C4-5 level, mild degenerative disc disease with bulging annulus is causing mild left foraminal narrowing and minimal compromise of thecal sac. 

3. At C5-6 level, degenerative disc disease with bulging annulus is causing mild biforaminal narrowing, left more than the right.

4. No focal lesions of cervical spinal cord.

## 2022-07-27 MED ORDER — LORAZEPAM 0.5 MG TABLET
0.5000 mg | ORAL_TABLET | Freq: Two times a day (BID) | ORAL | 2 refills | Status: DC | PRN
Start: 2022-07-27 — End: 2022-08-02

## 2022-07-27 MED ORDER — TRAZODONE 50 MG TABLET
50.0000 mg | ORAL_TABLET | Freq: Every evening | ORAL | 2 refills | Status: DC | PRN
Start: 2022-07-27 — End: 2022-08-02

## 2022-07-27 MED ORDER — SERTRALINE 25 MG TABLET
25.0000 mg | ORAL_TABLET | Freq: Every day | ORAL | 2 refills | Status: DC
Start: 2022-07-27 — End: 2022-10-25

## 2022-07-27 NOTE — Progress Notes (Signed)
BEHAVIORAL MEDICINE, [facility]  [place]  [place] [id number]  Operated by [facility]  Progress Note    Name: [name] MRN:  [id number]   Date: 07/27/2022 DOB:  12-29-1980 (42 y.o.)           Chief Complaint: Generalized Anxiety and Major Depression    Subjective:        Reason for office visit  Anxiety, depression, anhedonia      Chief Complaint:   "my nerves are shot. "    HPI:   [name] is a 42 year old white male who presents with complaint of anxiety and depression.  The patient states he has been treated by his family practitioner and has been on Paxil, Cymbalta, Celexa, and even Prozac.  He states he is having periods of feeling overwhelmed, he is having crying spells, and panic attacks.  He states that he is always had some type of anxiety and nervous disposition but he has always been able to maintain himself and control it.  He states over the last few months he has been in his situations where he can not control his emotions and mood.  He says he has no libido, he states the Paxil has really bothered that over the past few months.  He was changed to Celexa which he states really bothered him.  States he is very paranoid and had a very difficult time with the Celexa.  The patient is self-employed, he has just went through a very bitter divorce, he is overwhelmed with work and tries to keep everyone pleased and I think his divorce initially caused some animosity with him in his family.  The patient states that his ex-wife is still very bitter and he has to constantly feel like she was going to take him back to court for more support or something unexpected.      PHQ-9 score is 16    ROS:  Review of Systems:     All systems reviewed & are unremarkable except as noted in HPI and below  Constitutional: Yes alert, Yes alert and oriented x4 and Yes appears well  HENT: Yes normal HENT inspection and Yes hearing grossly normal  bilaterally  Respiratory: Yes normal respiratory effort, No no respiratory distress and Yes lungs clear  Cardiovascular:  No cardiac complaints, no chest pain  Gastrointestinal:  No GI complaints  Skin:  Warm,  dry, no rashes  Neurological:  No focal neurological deficits      Past psych history:  Anxiety and depression.  The patient has been on multiple antidepressants.    Past medical history:  NKDA   Past medical history is essentially negative      Family Psychiatric History:   Mother suffered with anxiety and depression    Family Medical History:  Coronary artery disease, diabetes    Social History:  42 year old white male, he is divorced, he has 2 children.  He is self-employed and does Architect work.  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  He has a high school graduate with an associate in business.  He has no history of drug or alcohol abuse with this patient.  He wakens and okay childhood was never physically abused or molested.  His main support consists primarily of his father.    MSE:  The patient is alert and oriented x4, casually dressed, fair eye contact, disheveled a bit, appearing stated  age.  Speech is normal rate and tone.  Patient is somewhat talkative and appears depressed. There is no flight of ideas, loosening of associations, or tangential speech.  Not manic.  Mood is sad.  Affect congruent.  Patient does not appear to be in any acute physical distress.  No overt suicidal ideations, no homicidal ideation.  No auditory or visual hallucinations, no delusions, no paranoia.  No signs of psychosis.  no plans to harm self, no plans to harm others.  Patient is not aggressive or threatening.  No psychomotor agitation.  No psychomotor retardation.  No abnormal involuntary movements. Thoughts are linear, logical, and goal directed.  Intellectual functioning is good.  Memory is intact to recent, remote, and past events.  Patient can recall 3 of 3 objects at 0 and 5  minutes, and what was eaten for last meal.  Patient is able to provide details of current situation.  Patient can name the president, vice president, and governor.  Language is good.  Vocabulary is unimpaired, no word finding difficulty or word misuse.  Intelligence is good, patient can interpret a proverb, and reports apple and orange similarity. Calculation is unimpaired.  Concentration is good, able to recite days of week forward and backward.  Insight is good; patient is aware of their illness, how it affects their functioning, and what needs to happen for future improvement.  Judgment is good; patient is compliant with treatment and can relate appropriately to what they would do if smelling smoke in a theater or finding stamped addressed envelope.      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                Generalized anxiety disorder                 Anhedonia  Axis 2:  Deferred  Axis 3: See past medical history  Axis 4: Severe psychosocial stressors  Axis 5: Current GAF:  60  , best in past year:  Unknown      Objective :  BP (!) 144/88 (Site: Left, Patient Position: Sitting, Cuff Size: Adult)   Pulse 56   Resp 18   Ht 1.778 m ('5\' 10"'$ )   Wt 81.6 kg (180 lb)   BMI 25.83 kg/m       Data reviewed:    Current Outpatient Medications   Medication Sig    azelastine (ASTELIN) 137 mcg (0.1 %) Nasal Aerosol, Spray Administer 2 Sprays into each nostril Once a day Use in each nostril as directed (Patient not taking: Reported on 07/27/2022)    beclomethasone dipropionate (QNASL) 80 mcg/actuation Nasal HFA Aerosol Inhaler Administer 2 Inhalers (160 mcg total) into affected nostril(s) Once a day    cetirizine (ZYRTEC) 10 mg Oral Tablet Take 1 Tablet (10 mg total) by mouth Once a day    Ibuprofen (MOTRIN) 800 mg Oral Tablet Take 1 Tablet (800 mg total) by mouth Three times a day as needed for Pain    LORazepam (ATIVAN) 0.5 mg Oral Tablet Take 1 Tablet (0.5 mg total) by mouth  Three times a day as needed    LORazepam (ATIVAN) 0.5 mg Oral Tablet Take 1 Tablet (0.5 mg total) by mouth Twice per day as needed for Anxiety    methocarbamoL (ROBAXIN) 750 mg Oral Tablet Take 1 Tablet (750 mg total) by mouth Three times a day as needed for Other    montelukast (SINGULAIR) 10 mg Oral Tablet Take 1  Tablet (10 mg total) by mouth Once a day    naproxen (NAPROSYN) 500 mg Oral Tablet Take 1 Tablet (500 mg total) by mouth Every 12 hours as needed for Pain (Patient not taking: Reported on 07/27/2022)    ondansetron (ZOFRAN ODT) 4 mg Oral Tablet, Rapid Dissolve Take 1 Tablet (4 mg total) by mouth Every 8 hours as needed for Nausea/Vomiting    sertraline (ZOLOFT) 25 mg Oral Tablet Take 1 Tablet (25 mg total) by mouth Once a day    traZODone (DESYREL) 50 mg Oral Tablet Take 1 Tablet (50 mg total) by mouth Nightly as needed, may repeat one time for Insomnia     Assessment/Plan  Moderate level of medical decision making, discussion of multiple diagnosis and symptoms, review of PHQ-9, review of symptoms, patient education, discussion of prescribed medications and potential side effects, discussion of psychosocial stressors, and review of prescription monitoring program information.    I have changed the patient's Zoloft 25 mg daily, I am going to add Ativan 0.5 mg b.i.d., I am also going to add trazodone 50 mg 1 or 2 tablets p.o. q.h.s..  I want to see the patient next week make sure he is tolerating medications okay we will make further adjustments and changes as needed.    Take medications as prescribed, avoid drugs and alcohol, call office if symptoms worsen or problems arise.  [phone].      Renae Fickle, PA-C

## 2022-08-02 ENCOUNTER — Other Ambulatory Visit: Payer: Self-pay

## 2022-08-02 ENCOUNTER — Encounter (HOSPITAL_PSYCHIATRIC): Payer: Self-pay | Admitting: PHYSICIAN ASSISTANT

## 2022-08-02 ENCOUNTER — Ambulatory Visit: Payer: MEDICAID | Attending: PHYSICIAN ASSISTANT | Admitting: PHYSICIAN ASSISTANT

## 2022-08-02 VITALS — BP 157/104 | HR 57 | Resp 18 | Ht 70.0 in | Wt 180.0 lb

## 2022-08-02 DIAGNOSIS — F332 Major depressive disorder, recurrent severe without psychotic features: Secondary | ICD-10-CM | POA: Insufficient documentation

## 2022-08-02 DIAGNOSIS — F411 Generalized anxiety disorder: Secondary | ICD-10-CM

## 2022-08-02 DIAGNOSIS — R4584 Anhedonia: Secondary | ICD-10-CM

## 2022-08-02 MED ORDER — ZOLPIDEM 10 MG TABLET
10.0000 mg | ORAL_TABLET | Freq: Every evening | ORAL | 3 refills | Status: DC | PRN
Start: 2022-08-02 — End: 2022-10-25

## 2022-08-02 MED ORDER — LORAZEPAM 1 MG TABLET
1.0000 mg | ORAL_TABLET | Freq: Two times a day (BID) | ORAL | 3 refills | Status: DC | PRN
Start: 2022-08-02 — End: 2022-10-25

## 2022-08-02 NOTE — Progress Notes (Signed)
BEHAVIORAL MEDICINE, [facility]  [place]  [place] [id number]  Operated by [facility]  Progress Note    Name: [name] MRN:  [id number]   Date: 08/02/2022 DOB:  28-Mar-1981 (42 y.o.)           Chief Complaint: Major Depression and Generalized Anxiety    Subjective:   HPI:   [name] is here to follow-up with me today for management of major depression and anxiety.  Patient states that since last visit he has been having more complications and stress and feeling overwhelmed.  He states he is still having complications with his ex-wife and family.  He states that his children are in a private school and he was issues concerning this.  The patient was given Ativan last visit, I will increase it today to 1 mg twice a day as needed and I am going to add Ambien 10 mg q.h.s. for sleep, we will continue with the Zoloft for now.  We will make changes in additions as needed.    PHQ-9 score is 18    ROS:  Review of Systems:     All systems reviewed & are unremarkable except as noted in HPI and below  Constitutional: Yes alert, Yes alert and oriented x4 and Yes appears well  HENT: Yes normal HENT inspection and Yes hearing grossly normal bilaterally  Respiratory: Yes normal respiratory effort, No no respiratory distress and Yes lungs clear  Cardiovascular:  No cardiac complaints, no chest pain  Gastrointestinal:  No GI complaints  Skin:  Warm,  dry, no rashes  Neurological:  No focal neurological deficits    MSE:   The patient is alert and oriented x4, casually dressed, fair eye contact, disheveled a bit, appearing stated age.  Speech is normal rate and tone.  Patient is somewhat talkative and appears depressed. There is no flight of ideas, loosening of associations, or tangential speech.  Not manic.  Mood is sad.  Affect congruent.  Patient does not appear to be in any acute physical distress.  No overt suicidal ideations, no homicidal ideation.   No auditory or visual hallucinations, no delusions, no paranoia.  No signs of psychosis.  no plans to harm self, no plans to harm others.  Patient is not aggressive or threatening.  No psychomotor agitation.  No psychomotor retardation.  No abnormal involuntary movements. Thoughts are linear, logical, and goal directed.  Intellectual functioning is good.  Memory is intact to recent, remote, and past events.  Patient can recall 3 of 3 objects at 0 and 5 minutes, and what was eaten for last meal.  Patient is able to provide details of current situation.  Patient can name the president, vice president, and governor.  Language is good.  Vocabulary is unimpaired, no word finding difficulty or word misuse.  Intelligence is good, patient can interpret a proverb, and reports apple and orange similarity. Calculation is unimpaired.  Concentration is good, able to recite days of week forward and backward.  Insight is good; patient is aware of their illness, how it affects their functioning, and what needs to happen for future improvement.  Judgment is good; patient is compliant with treatment and can relate appropriately to what they would do if smelling smoke in a theater or finding stamped addressed envelope.      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 p.r.n.  Ambien 10 mg q.h.s. p.r.n. sleep   Zoloft 25 mg daily    Allergies:  NKDA    [facility]        Objective :  BP (!) 157/104 (Site: Left, Patient Position: Sitting, Cuff Size: Adult)   Pulse 57   Resp 18   Ht 1.778 m (5\' 10" )   Wt 81.6 kg (180 lb)   BMI 25.83 kg/m         Data reviewed:    Current Outpatient Medications   Medication Sig    azelastine (ASTELIN) 137 mcg (0.1 %) Nasal Aerosol, Spray Administer 2 Sprays into each nostril Once a day Use in  each nostril as directed (Patient not taking: Reported on 07/27/2022)    beclomethasone dipropionate (QNASL) 80 mcg/actuation Nasal HFA Aerosol Inhaler Administer 2 Inhalers (160 mcg total) into affected nostril(s) Once a day    cetirizine (ZYRTEC) 10 mg Oral Tablet Take 1 Tablet (10 mg total) by mouth Once a day    Ibuprofen (MOTRIN) 800 mg Oral Tablet Take 1 Tablet (800 mg total) by mouth Three times a day as needed for Pain    LORazepam (ATIVAN) 1 mg Oral Tablet Take 1 Tablet (1 mg total) by mouth Twice per day as needed for Anxiety Pt will double the previous prescription for 0.5 BID until it runs out. Then please fill the 1 mg tabs    methocarbamoL (ROBAXIN) 750 mg Oral Tablet Take 1 Tablet (750 mg total) by mouth Three times a day as needed for Other    montelukast (SINGULAIR) 10 mg Oral Tablet Take 1 Tablet (10 mg total) by mouth Once a day    naproxen (NAPROSYN) 500 mg Oral Tablet Take 1 Tablet (500 mg total) by mouth Every 12 hours as needed for Pain (Patient not taking: Reported on 07/27/2022)    ondansetron (ZOFRAN ODT) 4 mg Oral Tablet, Rapid Dissolve Take 1 Tablet (4 mg total) by mouth Every 8 hours as needed for Nausea/Vomiting    sertraline (ZOLOFT) 25 mg Oral Tablet Take 1 Tablet (25 mg total) by mouth Once a day    zolpidem (AMBIEN) 10 mg Oral Tablet Take 1 Tablet (10 mg total) by mouth Every night as needed for Insomnia     Assessment/Plan  Moderate level of medical decision making, discussion of multiple diagnosis and symptoms, review of PHQ-9, review of symptoms, patient education, discussion of prescribed medications and potential side effects, discussion of psychosocial stressors, and review of prescription monitoring program information.    Increase Ativan 1 mg b.i.d. p.r.n., add Ambien 10 mg q.h.s.    Take medications as prescribed, avoid drugs and alcohol, call office if symptoms worsen or problems arise.  [phone].      Renae Fickle, PA-C

## 2022-08-05 ENCOUNTER — Inpatient Hospital Stay
Admission: RE | Admit: 2022-08-05 | Discharge: 2022-08-05 | Disposition: A | Discharge status: Home / Self Care | Payer: MEDICAID | Source: Ambulatory Visit

## 2022-08-05 ENCOUNTER — Other Ambulatory Visit: Payer: Self-pay

## 2022-08-05 DIAGNOSIS — S59909A Unspecified injury of unspecified elbow, initial encounter: Secondary | ICD-10-CM | POA: Insufficient documentation

## 2022-08-08 ENCOUNTER — Inpatient Hospital Stay
Admission: RE | Admit: 2022-08-08 | Discharge: 2022-08-08 | Disposition: A | Discharge status: Home / Self Care | Payer: MEDICAID | Source: Ambulatory Visit | Attending: Family | Admitting: Family

## 2022-08-08 ENCOUNTER — Other Ambulatory Visit: Payer: Self-pay

## 2022-08-08 ENCOUNTER — Other Ambulatory Visit (HOSPITAL_COMMUNITY): Payer: Self-pay | Admitting: Family

## 2022-08-08 DIAGNOSIS — R509 Fever, unspecified: Secondary | ICD-10-CM

## 2022-08-08 DIAGNOSIS — R059 Cough, unspecified: Secondary | ICD-10-CM

## 2022-10-06 ENCOUNTER — Ambulatory Visit (INDEPENDENT_AMBULATORY_CARE_PROVIDER_SITE_OTHER): Payer: MEDICAID | Admitting: Neurological Surgery

## 2022-10-18 ENCOUNTER — Ambulatory Visit (INDEPENDENT_AMBULATORY_CARE_PROVIDER_SITE_OTHER): Payer: MEDICAID | Admitting: PHYSICIAN ASSISTANT

## 2022-10-25 ENCOUNTER — Other Ambulatory Visit: Payer: Self-pay

## 2022-10-25 ENCOUNTER — Ambulatory Visit (INDEPENDENT_AMBULATORY_CARE_PROVIDER_SITE_OTHER): Payer: MEDICAID | Admitting: OTOLARYNGOLOGY

## 2022-10-25 ENCOUNTER — Encounter (INDEPENDENT_AMBULATORY_CARE_PROVIDER_SITE_OTHER): Payer: Self-pay | Admitting: OTOLARYNGOLOGY

## 2022-10-25 VITALS — Ht 70.0 in | Wt 180.0 lb

## 2022-10-25 DIAGNOSIS — J309 Allergic rhinitis, unspecified: Secondary | ICD-10-CM

## 2022-10-25 DIAGNOSIS — J342 Deviated nasal septum: Secondary | ICD-10-CM

## 2022-10-25 DIAGNOSIS — M26629 Arthralgia of temporomandibular joint, unspecified side: Secondary | ICD-10-CM

## 2022-10-25 DIAGNOSIS — H9201 Otalgia, right ear: Secondary | ICD-10-CM

## 2022-10-25 MED ORDER — CETIRIZINE 10 MG TABLET
10.0000 mg | ORAL_TABLET | Freq: Every day | ORAL | 3 refills | Status: AC
Start: 2022-10-25 — End: ?

## 2022-10-25 MED ORDER — MONTELUKAST 10 MG TABLET
10.0000 mg | ORAL_TABLET | Freq: Every day | ORAL | 3 refills | Status: AC
Start: 2022-10-25 — End: ?

## 2022-10-25 MED ORDER — AZELASTINE 137 MCG (0.1 %) NASAL SPRAY
2.0000 | Freq: Every day | NASAL | 3 refills | Status: AC
Start: 2022-10-25 — End: ?

## 2022-10-25 NOTE — Addendum Note (Signed)
Addended by: Archie Endo on: 10/25/2022 09:12 AM     Modules accepted: Orders

## 2022-10-25 NOTE — Procedures (Signed)
[facility], [facility]  [address]  [place] [address]    Procedure Note    Name: [name] MRN:  [id number]   Date: 10/25/2022 DOB:  1981/05/08 (42 y.o.)         31231 - NASAL ENDOSCOPY DIAGNOSTIC UNILATERAL OR BILATERAL (AMB ONLY)    Performed by: Conchita Paris, DO  Authorized by: Conchita Paris, DO    Time Out:     Immediately before the procedure, a time out was called:  Yes    Patient verified:  Yes    Procedure Verified:  Yes    Site Verified:  Yes  Documentation:      [facility]  [address] [place] [address]    Procedure Note    Name: AMDREW OBOYLE MRN:  [id number]  Date: 10/25/2022 DOB:  1981-01-06 (42 y.o.)        @PROCDOC @    Indications for procedure: Obstructive nasal breathing    Anesthesia: Oxymetazoline nasal spray    Description: Nasal endoscopy with rigid scope was performed with examination of the  septum, inferior, middle, and superior meatus, turbinates, sphenoethmoidal recess, and nasopharynx.     There were no polyps, pus, or granulation tissue noted.  ET orifices and nasopharynx were normal.     Findings: Septal deviation, allergic changes    The patient tolerated the procedure well.    Conchita Paris, DO             Chyanna Flock [place], DO

## 2022-10-25 NOTE — H&P (Signed)
[facility], [facility]  [address]  [place] [address]    Progress Note    Name: [name] MRN:  [id number]   Date: 10/25/2022 DOB:  [birth year] (42 y.o.)              Follow Up      Subjective:   Chief Complaint:   Allergic Rhinitis (Rc on allergies. States having jaw pain right jaw when biting down.)       History of Present Illness:  [name] is a 42 y.o. old male who presents to the clinic for follow-up. Patient states that allergy symptoms have been doing well with allergy medications. 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      Review of Systems     Physical Exam:     Vitals:    10/25/22 0822   Weight: 81.6 kg (180 lb)   Height: 1.778 m (5\' 10" )   BMI: 25.88      ENT Physical Exam  Constitutional  Appearance: patient appears well-developed, well-nourished and well-groomed,  Communication/Voice: communication appropriate for developmental age; vocal quality normal;  Head and Face  Appearance: head appears normal, face appears normal and face appears atraumatic;  Palpation: facial palpation normal;  Salivary: glands normal;  Ear  Hearing: intact;  Auricles: right auricle normal; left auricle normal;  External Mastoids: right external mastoid normal; left external mastoid normal;  Ear Canals: right ear canal normal; left ear canal normal;  Tympanic Membranes: right tympanic membrane normal; left tympanic membrane normal;  Nose  External Nose: nares patent bilaterally; external nose normal;  Internal Nose: nasal mucosa normal; nasal septal deviation present; bilateral inferior turbinates with hypertrophy;  Oral Cavity/Oropharynx  Lips: normal;  Teeth: normal;  Gums: gingiva normal;  Tongue: normal;  Oral mucosa: normal;  Hard palate: normal;  Soft palate: normal;  Tonsils: normal;  Base of Tongue: normal;  Posterior pharyngeal wall: normal;  Neck  Neck: neck normal; neck palpation normal;  Thyroid: thyroid normal;  Respiratory  Inspection: breathing unlabored; normal  breathing rate;  Lymphatic  Palpation: lymph nodes normal;  Neurovestibular  Mental Status: alert and oriented;  Psychiatric: mood normal; affect is appropriate;  Cranial Nerves: cranial nerves intact;       Assessment and Plan:       ICD-10-CM    1. Chronic allergic rhinitis  J30.9       2. 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    Conchita Paris, DO

## 2022-11-30 ENCOUNTER — Ambulatory Visit: Payer: MEDICAID

## 2022-11-30 ENCOUNTER — Other Ambulatory Visit: Payer: Self-pay

## 2022-11-30 ENCOUNTER — Encounter (INDEPENDENT_AMBULATORY_CARE_PROVIDER_SITE_OTHER): Payer: Self-pay

## 2022-11-30 VITALS — BP 140/98 | HR 76 | Resp 16 | Ht 70.0 in | Wt 185.0 lb

## 2022-11-30 DIAGNOSIS — R519 Headache, unspecified: Secondary | ICD-10-CM | POA: Insufficient documentation

## 2022-11-30 DIAGNOSIS — M5481 Occipital neuralgia: Secondary | ICD-10-CM

## 2022-11-30 DIAGNOSIS — M791 Myalgia, unspecified site: Secondary | ICD-10-CM | POA: Insufficient documentation

## 2022-11-30 DIAGNOSIS — W208XXA Other cause of strike by thrown, projected or falling object, initial encounter: Secondary | ICD-10-CM | POA: Insufficient documentation

## 2022-11-30 DIAGNOSIS — M5030 Other cervical disc degeneration, unspecified cervical region: Secondary | ICD-10-CM | POA: Insufficient documentation

## 2022-11-30 NOTE — H&P (Signed)
NEUROSURGERY, MEDICAL OFFICE BUILDING WEST  [address]  [place] [address]  Operated by [facility]    Name: [name] MRN:  [id number]   Date: 11/30/2022 DOB:  Jul 17, 1980 (42 y.o.)     Chief Complaint:  Axial neck pain and headache  Chief Complaint   Patient presents with    New Patient     New pt, DDD w/ bulging annulus-cervical. [name] referring. MRI cervical wo done @ [facility]--pt brought disc.        HPI:  42 year old gentleman who presents with a subacute history of axial neck pain.  He associates this with a work-related injury.  He was carrying something in a it fell on his shoulder and struck his neck.  This is in July of 2023.  He notes that since then he has had neck pain as well as pain that radiates into his scalp as well as into his eye.  He describes it as a sinus headache.  He notes that it can become so painful that he has to stop what he is doing.  It tends to be provoked with head extension.  He also notes that ibuprofen as well as muscle relaxants have helped in the past but they are not lasting relief.  He has had some physical therapy feels like that has not helped.  He has not tried acupuncture.  He has tried chiropractics without much benefit.  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    ROS: complete 12 point review of systems performed findings noted in HPI    PMH:   Past Medical History:   Diagnosis Date    GERD (gastroesophageal reflux disease)           PSxH:   Past Surgical History:   Procedure Laterality Date    ABDOMINAL SURGERY      HX HERNIA REPAIR        SHX:   Social History     Socioeconomic History    Marital status: Divorced     Spouse name: Not on file    Number of children: Not on file    Years of education: Not on file    Highest education level: Not on file   Occupational History    Not on file   Tobacco Use    Smoking status: Never     Passive exposure: Current    Smokeless  tobacco: Never   Vaping Use    Vaping status: Never Used   Substance and Sexual Activity    Alcohol use: Yes     Comment: occasionally    Drug use: Never    Sexual activity: Not on file   Other Topics Concern    Not on file   Social History Narrative    Not on file     Social Determinants of Health     Financial Resource Strain: Not on file   Transportation Needs: Not on file   Social Connections: Not on file   Intimate Partner Violence: Not on file   Housing Stability: Not on file      Social Determinates              PHYSICAL EXAM  Blood pressure (!) 140/98, pulse 76, resp. rate 16, height 1.778 m (5\' 10" ), weight 83.9 kg (185 lb), SpO2 99%.   Orientation/General - The patient is awake and alert, oriented x 3 following  commands x 4. The speech was fluent and comprehensive. the patient is in no acute distress. the patient is appropriate appearing for their stated age   Respiratory: The patient is breathing normally on room air and has normal chest excursion with  no upper airway congestive noises. no lip pursing, no tachypnea  Psychiatric: Normal mood, appropriate affect and attention span and appropriate language   Orthopedic: Rotation of the hip joint does not reproduce groin pain and there is no tenderness over the trochanteric bursa.  Vascular/Cardiac: The patient has a normal dorsalis pedis pulse with a regular rate and rhythm  Skin: The skin turgor is normal with no abrasions or rash   Neurologic:   Lower EXT testing  Motor   right 5 hip flexion, 5knee extension, 5ankle dorsiflexion, 5ankle eversion, 5great toes extension, 5ankle planter flexion, 5hip  extension   left  5 hip flexion, 5knee extension, 5ankle dorsiflexion, 5ankle eversion, 5great toes extension, 5ankle planter flexion,5 hip extension     Reflex exam:  Right: 2+patella, 2+ achilles; brachioradialis2+ biceps2+; triceps 2+  Left: 2+ patella,2+achilles; brachioradialis 2+; biceps 2+; triceps2+     Sensory - normal scratch, temperature and  vibratory sensation in the right leg in the L3, L4, L5, S1 dermatomes  normal scratch, temperature and vibratory sensation in the left leg in the L3, L4, L5, S1 dermatomes straight leg raise testing did not reproduce your patients pain   Upper EXT testing  Motor -  Right 5  deltoid,  5 biceps,  5 extensor carpi radialis, 5 extensor digitorum, 5 flexor digitorum profundus I-IV, triceps.   Left 5 deltoid,5 biceps, 5 extensor carpi radialis, 5 extensor digitorum, 5 flexor digitorum  5 profundus I-IV, 5 triceps.    Sensory exam - There is normal sensation to temperature, scratch and vibration in the RIGHT C5, C6, C7, C8 dermatomes.  there is normal sensation to temperature, scratch and vibration in the LEFT C5, C6, C7, C8 dermatomes      Reflex exam  Right 2+biceps, 2+triceps, and 2+brachioradialis    LEFT - 2+biceps, 2+triceps, and 2+brachioradialis      Pathologic reflexes  Trommner's Present: no  Hoffman's  Present: no  Toes:  down going  Clonus: absent at the ankle           Radiology/EMG/NCS/Lab Review:  Which demonstrates degenerative disc disease at several levels without evidence of nerve root compression or spinal cord compression.  Has a fairly normal alignment.    Assessment and Plan:  Mr. [name] is a pleasant 42 year old gentleman who presents with axial neck pain and occipital neuralgias his primary complaints.  His occipital myalgias and more dominant concern for him.  He has had some conservative therapies for this.  I think he could benefit from working with 1 of the pain management providers for his occipital neuralgia.  This could include ablations and blocks as they would see fit.  I also recommended him finding an acupuncturist.    Follow-up p.r.n.            I very much appreciate you giving me the opportunity to participate in [name]'s care, kindest personal regards.     Donnetta Simpers MD, FAANS   Associate Professor of Neurosurgery   [facility]          This note was partially  generated using MModal Fluency Direct system, and there may be some incorrect words, spellings, and punctuation that were not noted in checking the note before  saving.   Special Instructions

## 2023-01-11 ENCOUNTER — Encounter (HOSPITAL_BASED_OUTPATIENT_CLINIC_OR_DEPARTMENT_OTHER): Payer: Self-pay | Admitting: PAIN MANAGEMENT

## 2023-01-11 ENCOUNTER — Ambulatory Visit: Payer: MEDICAID | Attending: PAIN MANAGEMENT | Admitting: PAIN MANAGEMENT

## 2023-01-11 ENCOUNTER — Other Ambulatory Visit: Payer: Self-pay

## 2023-01-11 VITALS — BP 150/97 | HR 77 | Ht 70.0 in | Wt 185.0 lb

## 2023-01-11 DIAGNOSIS — M5030 Other cervical disc degeneration, unspecified cervical region: Secondary | ICD-10-CM | POA: Insufficient documentation

## 2023-01-11 DIAGNOSIS — M47812 Spondylosis without myelopathy or radiculopathy, cervical region: Secondary | ICD-10-CM

## 2023-01-11 DIAGNOSIS — G894 Chronic pain syndrome: Secondary | ICD-10-CM

## 2023-01-11 LAB — DRUG SCREEN, WITH CONFIRMATION, URINE
AMPHETAMINES URINE: POSITIVE — AB
BARBITURATES URINE: NEGATIVE
BENZODIAZEPINES URINE: NEGATIVE
BUPRENORPHINE URINE: NEGATIVE
CANNABINOIDS URINE: NEGATIVE
COCAINE METABOLITES URINE: NEGATIVE
CREAT-ADULTERATION: 31.7 mg/dL (ref 22.0–250.0)
ETHANOL, URINE: NEGATIVE
FENTANYL, URINE: NEGATIVE
GABAPENTIN, URINE: NEGATIVE
HEROIN URINE: NEGATIVE
METHADONE URINE: NEGATIVE
OPIATES URINE: NEGATIVE
OXYCODONE URINE: NEGATIVE
PH-ADULTERATION: 6.7 (ref 4.5–8.0)

## 2023-01-11 NOTE — Nursing Note (Signed)
Here today for new patient visit referred by Dr. Sherryll Burger for neck pain.  States the pain started last year after a stack of lumber feel on him.   Patient c/o neck pain & headaches.  Worse with turning head side to side & looking up/down.  Better with keeping the head straight &heat.  No history of spine surgery or injections.  States seeing Dr. Sherryll Burger & surgery was not recommended.  States having an MRI of cervical spine @ [facility] in [place], [place]. Will fax for report.  CT of cervical spine done on 05/27/22. Report in Epic.          01/11/2023   NECK DISABILITY INDEX   Pain Intensity 3   Personal Care (Washing, Dressing, etc.) 1   Lifting 2   Reading 2   Headaches 4   Concentration 2   Work 3   Driving 2   Sleeping 4   Recreation 3   Score 26   Neck Disability Percentage 52        ,       01/11/2023   Conservative Therapies   Type of Injury Personal   Date of personal injury 12/08/21   Has the patient participated in Physical Therapy? No   Has the patient participated in Chiropractic Manipulation? Yes   Dates of chiropractic manipulation 2023   Actively participating in chiropractic manipulation No   Any relief with chiropractic manipulation No   Has the patient participated in Home Exercise? Yes   Dates of home exercise 2024   Actively participating in home exercise Yes   Any relief with home exercise Yes   Additional Previous Treatments None   Previous Medications NSAIDS;Acetaminophen;Muscle relaxants   Surgical Eval Yes   Surgeon seen and date Dr. Sherryll Burger in 2024   Was surgery recommended? No        ,       01/11/2023     2:00 PM   Opioid Risk Assessment   Family History Illegal Drug Abuse 0   Family History of Prescription Drug Abuse 0   Personal History of Illegal Drug Use 0   Personal History Prescription Drug Abuse 0   Age 99-17 years old 1   Depression Diagnosis 1   Total Score 2        Previous XRays:  XR CHEST PA AND LATERAL  Narrative: [name]    RADIOLOGISTAlvester Chou, MD    XR CHEST PA AND  LATERAL performed on 08/08/2022 5:05 PM    CLINICAL HISTORY: R05.9: Cough, unspecified type  R50.9: Fever, unspecified fever cause.  COUGH, FEVER    TECHNIQUE: Frontal and lateral views of the chest.    COMPARISON:  04/05/2022    FINDINGS:    The heart size is normal.  The mediastinal contour is unremarkable.  The lungs are clear.   The bones are unremarkable.  Impression: NO ACUTE FINDINGS.    Radiologist location ID: [id number]      Previous CTs:  Results for orders placed during the hospital encounter of 05/27/22    CT CERVICAL SPINE WO IV CONTRAST    Narrative  [name]    RADIOLOGIST: Mallie Mussel    CT CERVICAL SPINE WO IV CONTRAST performed on 05/27/2022 8:46 AM    CLINICAL HISTORY: Fall.  fall head injury, hit back of head, pain    TECHNIQUE:  Cervical spine CT without contrast.    COMPARISON: 12/08/2021  # of known CTs in the  past 12 months:  3  # of known Cardiac Nuclear Medicine Studies in the past 12 months:  0    FINDINGS:  Alignment: Normal    Vertebrae: No acute fracture    Soft Tissues:   No large prevertebral hematoma    Impression  NO ACUTE CERVICAL FRACTURE      One or more dose reduction techniques were used (e.g., Automated exposure control, adjustment of the mA and/or kV according to patient size, use of iterative reconstruction technique).      Radiologist location ID: [id number]      Previous MRIs  No results found for this or any previous visit.

## 2023-01-11 NOTE — H&P (Signed)
PAIN MANAGEMENT, [facility] BUILDING WEST  [address]  [place] [address]  Operated by [facility]  History and Physical    Name: [name] MRN:  [id number]   Date: 01/11/2023 DOB:  1980/08/19 (42 y.o.)               Provider: [name], MD  PCP: Yancey Flemings, FNP-BC  Referring Provider: Donnetta Simpers     Reason for visit: Neck Pain (Current pain level 6, Best 4 & Worst 6./Describes the pain as constant aching & throbbing.)      Objective:  Nursing Notes:   Carylon Perches, RN  01/11/23 1444  Signed  Here today for new patient visit referred by Dr. Sherryll Burger for neck pain.  States the pain started last year after a stack of lumber feel on him.   Patient c/o neck pain & headaches.  Worse with turning head side to side & looking up/down.  Better with keeping the head straight &heat.  No history of spine surgery or injections.  States seeing Dr. Sherryll Burger & surgery was not recommended.  States having an MRI of cervical spine @ [facility] in [place], [place]. Will fax for report.  CT of cervical spine done on 05/27/22. Report in Epic.          01/11/2023   NECK DISABILITY INDEX   Pain Intensity 3   Personal Care (Washing, Dressing, etc.) 1   Lifting 2   Reading 2   Headaches 4   Concentration 2   Work 3   Driving 2   Sleeping 4   Recreation 3   Score 26   Neck Disability Percentage 52        ,       01/11/2023   Conservative Therapies   Type of Injury Personal   Date of personal injury 12/08/21   Has the patient participated in Physical Therapy? No   Has the patient participated in Chiropractic Manipulation? Yes   Dates of chiropractic manipulation 2023   Actively participating in chiropractic manipulation No   Any relief with chiropractic manipulation No   Has the patient participated in Home Exercise? Yes   Dates of home exercise 2024   Actively participating in home exercise Yes   Any relief with home exercise Yes   Additional Previous Treatments None   Previous Medications  NSAIDS;Acetaminophen;Muscle relaxants   Surgical Eval Yes   Surgeon seen and date Dr. Sherryll Burger in 2024   Was surgery recommended? No        ,       01/11/2023     2:00 PM   Opioid Risk Assessment   Family History Illegal Drug Abuse 0   Family History of Prescription Drug Abuse 0   Personal History of Illegal Drug Use 0   Personal History Prescription Drug Abuse 0   Age 30-58 years old 1   Depression Diagnosis 1   Total Score 2        Previous XRays:  XR CHEST PA AND LATERAL  Narrative: Kimmy A Delamater    RADIOLOGISTAlvester Chou, MD    XR CHEST PA AND LATERAL performed on 08/08/2022 5:05 PM    CLINICAL HISTORY: R05.9: Cough, unspecified type  R50.9: Fever, unspecified fever cause.  COUGH, FEVER    TECHNIQUE: Frontal and lateral views of the chest.    COMPARISON:  04/05/2022    FINDINGS:    The heart size is normal.  The mediastinal contour is unremarkable.  The lungs are clear.   The bones are unremarkable.  Impression: NO ACUTE FINDINGS.    Radiologist location ID: [id number]      Previous CTs:  Results for orders placed during the hospital encounter of 05/27/22    CT CERVICAL SPINE WO IV CONTRAST    Narrative  Ahren A Schools    RADIOLOGIST: Mallie Mussel    CT CERVICAL SPINE WO IV CONTRAST performed on 05/27/2022 8:46 AM    CLINICAL HISTORY: Fall.  fall head injury, hit back of head, pain    TECHNIQUE:  Cervical spine CT without contrast.    COMPARISON: 12/08/2021  # of known CTs in the past 12 months:  3  # of known Cardiac Nuclear Medicine Studies in the past 12 months:  0    FINDINGS:  Alignment: Normal    Vertebrae: No acute fracture    Soft Tissues:   No large prevertebral hematoma    Impression  NO ACUTE CERVICAL FRACTURE      One or more dose reduction techniques were used (e.g., Automated exposure control, adjustment of the mA and/or kV according to patient size, use of iterative reconstruction technique).      Radiologist location ID: [id number]      Previous MRIs  No results found for this or any  previous visit.          HPI:    Neck Pain         Past Medical History:  Past Medical History:   Diagnosis Date    GERD (gastroesophageal reflux disease)      Past Surgical History:   Procedure Laterality Date    ABDOMINAL SURGERY      HX HERNIA REPAIR        No Known Allergies  Current Outpatient Medications   Medication Sig    ADDERALL 10 mg Oral Tablet     azelastine (ASTELIN) 137 mcg (0.1 %) Nasal Aerosol, Spray Administer 2 Sprays into each nostril Once a day Use in each nostril as directed    cetirizine (ZYRTEC) 10 mg Oral Tablet Take 1 Tablet (10 mg total) by mouth Once a day    Ibuprofen (MOTRIN) 800 mg Oral Tablet Take 1 Tablet (800 mg total) by mouth Three times a day as needed for Pain    methocarbamoL (ROBAXIN) 750 mg Oral Tablet Take 1 Tablet (750 mg total) by mouth Three times a day as needed for Other    montelukast (SINGULAIR) 10 mg Oral Tablet Take 1 Tablet (10 mg total) by mouth Once a day    ondansetron (ZOFRAN ODT) 4 mg Oral Tablet, Rapid Dissolve Take 1 Tablet (4 mg total) by mouth Every 8 hours as needed for Nausea/Vomiting    risperiDONE (RISPERDAL) 0.25 mg Oral Tablet Take 2 tablets twice a day by oral route.    sertraline (ZOLOFT) 50 mg Oral Tablet Take 1 tablet every day by oral route.     Family Medical History:    None        Social History     Socioeconomic History    Marital status: Divorced   Tobacco Use    Smoking status: Former     Types: Cigarettes     Passive exposure: Current    Smokeless tobacco: Never   Vaping Use    Vaping status: Never Used   Substance and Sexual Activity    Alcohol use: Yes     Comment: occasionally    Drug  use: Never       Physical Exam:  Vital Signs:  BP (!) 150/97   Pulse 77   Ht 1.778 m (5\' 10" )   Wt 83.9 kg (185 lb)   BMI 26.54 kg/m         Physical Exam  Vitals and nursing note reviewed.   Constitutional:       Appearance: Normal appearance.   HENT:      Head: Normocephalic and atraumatic.   Musculoskeletal:      Cervical back: Spasms, tenderness  and bony tenderness present. Pain with movement, spinous process tenderness and muscular tenderness present. Decreased range of motion.   Neurological:      Mental Status: He is alert.   Psychiatric:         Mood and Affect: Mood normal.         Behavior: Behavior normal.         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 DDD (degenerative disc disease), cervical  M50.30       3. Chronic pain syndrome  G89.4 DRUG SCREEN, WITH CONFIRMATION, URINE           Plan:  Orders Placed This Encounter    DRUG SCREEN, WITH CONFIRMATION, URINE    CASE REQUEST SURGICAL: PAIN SERVICE INJECTION FACET CERVICAL/THORACIC     Patient presents today with chronic intractable neck pain.  Pain is reproducible with turning of the neck from side to side and looking up.  Patient also reports spasms in the neck and shoulder region.  Patient reports the pain started a year ago after lumbar fell on his head and body and knocked him out.  CT scan of the cervical spine was reviewed which did not show any fracture or dislocation.  Patient was also seen by a neurosurgeon who did not recommend surgery.  Patient has tried and failed chiropractor as well as physical therapy.  I believe patient may have sustained whiplash type of injury when he was hit by lumber.  Plan at this time is to schedule patient for cervical medial branch block at C3, C4, C5  level under fluoroscopy as a diagnostic block to help with the pain and function.  Radiofrequency ablation will be considered for long-term pain relief if patient does well with diagnostic block.  Physical therapy is recommended to improve pain, function, and quality of life.  This will improve range of motion and strengthening of neck and upper extremity muscles to help with activities of daily life and ambulation.  Patient denies any new injury or trauma.  Patient denies any new neurological symptom  changes suggestive bladder or bowel dysfunction.  Patient to call back for appointment if the pain level changes for worse.  I will discuss physical therapy, injection therapy or medication changes if necessary.  Patient will call the office if there is any questions or other new problems.  Long-term goal is to improve pain, function, and quality of life with minimally invasive procedures.    Return Patient will F/U after his injection.    [name], MD     Portions of this note may be dictated using voice recognition software or a dictation service. Variances in spelling and vocabulary are possible and unintentional. Not all errors are caught/corrected. Please notify the Thereasa Parkin if any discrepancies are noted or if the meaning of any statement is not clear.

## 2023-01-12 ENCOUNTER — Other Ambulatory Visit (HOSPITAL_BASED_OUTPATIENT_CLINIC_OR_DEPARTMENT_OTHER): Payer: Self-pay

## 2023-01-12 ENCOUNTER — Telehealth (HOSPITAL_BASED_OUTPATIENT_CLINIC_OR_DEPARTMENT_OTHER): Payer: Self-pay | Admitting: PAIN MANAGEMENT

## 2023-01-12 NOTE — Telephone Encounter (Signed)
Patient called state he was able to see his urine drug screen results and there are some positives on there that bothers him some and wanted to someone to return his call so when I was able to return his call I let him know until we have the confirmation result we usually don't assume anything and then if there were an issue we would reach out to him

## 2023-01-13 LAB — AMP/METHAMP CONFIRMATION (LAB ONLY)
AMPHETAMINE: 1136.7 ng/mL — AB
METHAMPHETAMINE: NEGATIVE ng/mL
PHENTERMINE: NEGATIVE ng/mL

## 2023-01-18 ENCOUNTER — Ambulatory Visit (HOSPITAL_BASED_OUTPATIENT_CLINIC_OR_DEPARTMENT_OTHER): Payer: Self-pay | Admitting: PAIN MANAGEMENT

## 2023-02-02 ENCOUNTER — Other Ambulatory Visit (HOSPITAL_BASED_OUTPATIENT_CLINIC_OR_DEPARTMENT_OTHER): Payer: Self-pay

## 2023-02-10 ENCOUNTER — Inpatient Hospital Stay
Admission: RE | Admit: 2023-02-10 | Discharge: 2023-02-10 | Disposition: A | Discharge status: Home / Self Care | Payer: MEDICAID | Source: Ambulatory Visit | Attending: PAIN MANAGEMENT | Admitting: PAIN MANAGEMENT

## 2023-02-10 ENCOUNTER — Encounter (HOSPITAL_BASED_OUTPATIENT_CLINIC_OR_DEPARTMENT_OTHER): Payer: Self-pay | Admitting: PAIN MANAGEMENT

## 2023-02-10 ENCOUNTER — Other Ambulatory Visit: Payer: Self-pay

## 2023-02-10 ENCOUNTER — Other Ambulatory Visit (HOSPITAL_COMMUNITY): Payer: MEDICAID

## 2023-02-10 ENCOUNTER — Encounter (HOSPITAL_BASED_OUTPATIENT_CLINIC_OR_DEPARTMENT_OTHER)
Admission: RE | Disposition: A | Discharge status: Home / Self Care | Payer: Self-pay | Source: Ambulatory Visit | Attending: PAIN MANAGEMENT

## 2023-02-10 ENCOUNTER — Encounter (HOSPITAL_BASED_OUTPATIENT_CLINIC_OR_DEPARTMENT_OTHER): Payer: MEDICAID | Admitting: PAIN MANAGEMENT

## 2023-02-10 DIAGNOSIS — M47812 Spondylosis without myelopathy or radiculopathy, cervical region: Secondary | ICD-10-CM | POA: Insufficient documentation

## 2023-02-10 SURGERY — PAIN SERVICE INJECTION FACET CERVICAL/THORACIC
Anesthesia: Local (Nurse-Monitored) | Site: Neck | Laterality: Bilateral | Wound class: Clean Wound: Uninfected operative wounds in which no inflammation occurred

## 2023-02-10 MED ORDER — LIDOCAINE HCL 10 MG/ML (1 %) INJECTION SOLUTION
Freq: Once | INTRAMUSCULAR | Status: DC | PRN
Start: 2023-02-10 — End: 2023-02-10
  Administered 2023-02-10: 20 mL via INTRAMUSCULAR

## 2023-02-10 MED ORDER — BUPIVACAINE (PF) 0.5 % (5 MG/ML) INJECTION SOLUTION
Freq: Once | INTRAMUSCULAR | Status: DC | PRN
Start: 2023-02-10 — End: 2023-02-10
  Administered 2023-02-10: 6 mL via INTRAMUSCULAR

## 2023-02-10 NOTE — OR Surgeon (Signed)
OPERATIVE NOTE    Patient Name: [name]  Age:  42 y.o.  Sex:  male  MRN:  [id number]  Date of Service: 02/10/2023  Date of Birth: [birth year]    Attending Surgeon: [name], MD  Assistant(s):Circulator: Janelle Floor, RN  Scrub Person: Grafton Folk, ST  X-Ray Technologist: Cristal Generous, RT (R)     Pre-Operative Diagnosis: Cervical spondy   Post-Operative Diagnosis: Cervical spondy  Procedure: Procedure(s):  BILATERAL C3, C4, C5 MBNB TO TREAT C3-4, C4-5    Anesthesia: Local (Nurse-Monitored), No anesthesia staff entered.   Specimens/ Cultures: * No specimens in log *   Implants: * No implants in log *   Estimated Blood Loss: Minimal  Complications: None (Not routinely expected or not inherent to difficulty/nature of procedure)    Description of Procedure:  Procedure performed:  Diagnostic Bilateral  Cervical C3, C4, C5 medial branch block at  C3-4 and C4-5level under fluoroscopy    Description of procedure:  Following providing a detailed description of the intended procedure, including the risk and benefit and alternative options,  patient was taken to the procedure room and was placed in the prone position on the fluoroscopy table.  Routine monitors were applied.  The posterior aspect of the cervical spine was prepped and draped in sterile condition.  Under fluoroscopy guidance cervical thoracic spine was visualized and the ordered levels on bilateral side of the cervical spine was visualized and located.  Each area was infiltrated with 3 cc of 1% lidocaine.  A 25 gauge spinal needle was introduced at each level until the tip of needle was placed in the medial portion of the lamina for facet joint medial branch block.  After negative aspiration for blood, CSF, or air, 6 cc of 0.5% Marcaine  was injected in divided doses at each level without a problem.  The patient tolerated the procedure well.  There was no complication.  The patient was taken to recovery  room for 15 minutes observation.  The patient was discharged in satisfactory condition.    Of discharge instruction:  The patient was told to place ice over the injection site for next 30 minutes on 3 separate occasions.  The patient was instructed to watch for possible side effects or adverse reactions and to contact us if there is any problem.  Patient was also instructed to go to the emergency room if there is any chest pain, breathing difficulty or neurological deficit.     Disposition: Post Anesthesia Care Unit  Condition: Stable    [name], MD

## 2023-02-10 NOTE — H&P (Signed)
H&P UPDATE FORM                                                                                  [name], [name], 42 y.o. male  Date of Admission:  02/10/2023  Date of Birth:  1981/04/14    02/10/2023    STOP: IF H&P IS GREATER THAN 30 DAYS FROM SURGICAL DAY COMPLETE NEW H&P IS REQUIRED.     H & P updated the day of the procedure.  1.  H&P completed within 30 days of surgical procedure  and has been reviewed within 24 hours of the surgery, the patient has been examined, and no change has occured in the patients condition since the H&P was completed.       Change in medications: No     No LMP for male patient.      Comments:     2.  Patient continues to be appropriate candidate for planned surgical procedure. YES    [name], MD

## 2023-02-10 NOTE — Discharge Instructions (Signed)
-

## 2023-02-10 NOTE — Nurses Notes (Signed)
Ice pack applied to injection sites. VSS. Pt denies any questions or concerns. Pt ambulated out with staff.

## 2023-02-16 ENCOUNTER — Telehealth (HOSPITAL_BASED_OUTPATIENT_CLINIC_OR_DEPARTMENT_OTHER): Payer: Self-pay | Admitting: PAIN MANAGEMENT

## 2023-02-20 ENCOUNTER — Emergency Department
Admission: EM | Admit: 2023-02-20 | Discharge: 2023-02-20 | Discharge status: Against Medical Advice | Payer: MEDICAID | Source: Home / Self Care | Attending: Emergency Medicine | Admitting: Emergency Medicine

## 2023-02-20 ENCOUNTER — Other Ambulatory Visit: Payer: Self-pay

## 2023-02-20 ENCOUNTER — Encounter (HOSPITAL_BASED_OUTPATIENT_CLINIC_OR_DEPARTMENT_OTHER): Payer: Self-pay

## 2023-02-20 DIAGNOSIS — Z5329 Procedure and treatment not carried out because of patient's decision for other reasons: Secondary | ICD-10-CM | POA: Insufficient documentation

## 2023-02-20 NOTE — ED Triage Notes (Signed)
Scrotal and groin pain for a few week , no urinary symptoms

## 2023-02-20 NOTE — ED Nurses Note (Signed)
Pt states he is leaving, a doctor in [place] told him he could get an ultrasound  done quick over here, he states he has to pick up kids soon, I informed pt we do not have u/s since [facility] [place] [facility] took over hospital , he walked out door.

## 2023-02-27 ENCOUNTER — Ambulatory Visit (HOSPITAL_BASED_OUTPATIENT_CLINIC_OR_DEPARTMENT_OTHER): Payer: Self-pay | Admitting: NURSE PRACTITIONER

## 2023-03-08 ENCOUNTER — Other Ambulatory Visit: Payer: Self-pay

## 2023-03-08 ENCOUNTER — Ambulatory Visit: Payer: MEDICAID

## 2023-03-08 ENCOUNTER — Encounter (HOSPITAL_BASED_OUTPATIENT_CLINIC_OR_DEPARTMENT_OTHER): Payer: Self-pay

## 2023-03-08 DIAGNOSIS — M542 Cervicalgia: Secondary | ICD-10-CM | POA: Insufficient documentation

## 2023-03-08 DIAGNOSIS — M47812 Spondylosis without myelopathy or radiculopathy, cervical region: Secondary | ICD-10-CM | POA: Insufficient documentation

## 2023-03-08 DIAGNOSIS — G894 Chronic pain syndrome: Secondary | ICD-10-CM | POA: Insufficient documentation

## 2023-03-08 DIAGNOSIS — M5030 Other cervical disc degeneration, unspecified cervical region: Secondary | ICD-10-CM | POA: Insufficient documentation

## 2023-03-08 NOTE — H&P (Signed)
PAIN MANAGEMENT, [facility]  [address]  [place] [address]  Operated by [facility]  History and Physical    Name: [name] MRN:  [id number]   Date: 03/08/2023 DOB:  06-21-1980 (42 y.o.)               Provider: Fuller Plan, FNP-C  PCP: Yancey Flemings, FNP-BC  Referring Provider: Yancey Flemings     Reason for visit: Neck Pain      Objective:  Nursing Notes:   Dayna Ramus, RN  03/08/23 1257  Signed  Follow up  Had bilateral cervical mbnb c3,c4,c5 on 02/10/23 by Dr Selena Batten.Had relief for the first day. States pain is back to baseline.    Current pain rated: 6/10. Best: 2/10. Worst: 9/10.    Neck pain described as constant aching, dull aching, and stabbing. Extends down right arm down to fingers.  Eases with Positioning.   Aggravated by Activity.    Taking Ibuprofen - providing very little pain relief.  JMcBride, RN          03/08/2023   NECK DISABILITY INDEX   Pain Intensity 2   Personal Care (Washing, Dressing, etc.) 1   Lifting 3   Reading 3   Headaches 5   Concentration 1   Work 3   Driving 3   Sleeping 3   Recreation 3   Score 27   Neck Disability Percentage 54        ,       03/08/2023   Conservative Therapies   Type of Injury Personal   Date of personal injury 12/08/21   Has the patient participated in Physical Therapy? No   Has the patient participated in Chiropractic Manipulation? Yes   Dates of chiropractic manipulation 2023   Actively participating in chiropractic manipulation No   Any relief with chiropractic manipulation No   Has the patient participated in Home Exercise? Yes   Dates of home exercise 2024   Actively participating in home exercise Yes   Any relief with home exercise Yes   Additional Previous Treatments None   Previous Medications NSAIDS;Acetaminophen;Muscle relaxants   Surgical Eval Yes   Surgeon seen and date Dr. Sherryll Burger in 2024   Was surgery recommended? No          HPI:  Pt here for follow up appt.  Patient following up after  BILATERAL C3, C4, C5 Cervical MBNB (treating C3-4, C4-5 facet levels) on 02/10/2023.  Reports at least 100% of relief after procedure.  Pt reports continued relief lasting the rest of the day.  Patient reports improvement in pain, function, and ability to complete ADL's without severe pain after interventional therapy.     Discussed with pt that the next step moving forward is repeating MBNBs to complete a series of 2.  Pending amount of relief obtained, pt would then continue with an RFA for longer term relief.    Pt verbalizes understanding and is agreeable to continue with this tx plan.     Denies any other acute changes.  No new numbness, tingling, weakness, or radicular pain at this time.  Discussed differing pain generators along with treatments of each and expectation thereof.  Also discussed inability to provide guarantee of any interventional treatment offered, given the subjectivity, individuality, and multifactorial components of chronic pain.        Past Medical History:  Past Medical History:   Diagnosis Date    GERD (gastroesophageal reflux disease)  Past Surgical History:   Procedure Laterality Date    ABDOMINAL SURGERY      HX HERNIA REPAIR        No Known Allergies  Current Outpatient Medications   Medication Sig    ADDERALL 10 mg Oral Tablet     azelastine (ASTELIN) 137 mcg (0.1 %) Nasal Aerosol, Spray Administer 2 Sprays into each nostril Once a day Use in each nostril as directed    cetirizine (ZYRTEC) 10 mg Oral Tablet Take 1 Tablet (10 mg total) by mouth Once a day    Ibuprofen (MOTRIN) 800 mg Oral Tablet Take 1 Tablet (800 mg total) by mouth Three times a day as needed for Pain    methocarbamoL (ROBAXIN) 750 mg Oral Tablet Take 1 Tablet (750 mg total) by mouth Three times a day as needed for Other    montelukast (SINGULAIR) 10 mg Oral Tablet Take 1 Tablet (10 mg total) by mouth Once a day    ondansetron (ZOFRAN ODT) 4 mg Oral Tablet, Rapid Dissolve Take 1 Tablet (4 mg total) by mouth Every  8 hours as needed for Nausea/Vomiting    sertraline (ZOLOFT) 50 mg Oral Tablet Take 1 tablet every day by oral route.     Family Medical History:    None        Social History     Socioeconomic History    Marital status: Divorced   Tobacco Use    Smoking status: Former     Types: Cigarettes    Smokeless tobacco: Never   Vaping Use    Vaping status: Never Used   Substance and Sexual Activity    Alcohol use: Yes     Comment: occasionally    Drug use: Never       Physical Exam:  Vital Signs:  BP 134/86   Pulse 76   Ht 1.778 m (5\' 10" )   Wt 77.1 kg (170 lb)   BMI 24.39 kg/m         Physical Exam  Vitals and nursing note reviewed.   Constitutional:       Appearance: Normal appearance.   Musculoskeletal:      Cervical back: Spasms, tenderness and bony tenderness present. Pain with movement, spinous process tenderness and muscular tenderness present. Decreased range of motion.      Thoracic back: Normal.      Lumbar back: Normal.   Neurological:      Mental Status: He is alert and oriented to person, place, and time.      Sensory: Sensation is intact.      Motor: Motor function is intact.      Coordination: Coordination is intact.      Gait: Gait is intact.   Psychiatric:         Attention and Perception: Attention normal.         Mood and Affect: Mood and affect normal.         Speech: Speech normal.         Behavior: Behavior normal. Behavior is cooperative.         Thought Content: Thought content normal.         Cognition and Memory: Cognition normal.         Assessment:    ICD-10-CM    1. 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  Neck pain  M54.2            Plan:  Orders Placed This Encounter    CASE REQUEST SURGICAL: PAIN SERVICE INJECTION FACET CERVICAL/THORACIC, PAIN SERVICE BLOCK INJECTION FACET MEDIAL BRANCH CERVICAL 2 LEVELS     Order:  BILATERAL C3, C4, C5 Cervical MBNB (treating C3-4, C4-5 facet levels) under fluoroscopic guidance with Dr. Selena Batten.   Follow up after procedure.   This will be #2/2, completing the series.    Pending amount of relief, consider Cervical RFA.    No other needs voiced at this time.    Patient's pain and function are stable at this time.  Continue to monitor progress from procedure(s).  Continue with current treatment plan.  Can repeat procedure(s) in the future, as needed.  Encourage ROM, exercise, and activity as tolerated.    Continues a directed HEP without significant improvement long-term, but this in conjunction with other multimodal and interventional strategies prove to be beneficial in maintaining pain and function.    Long-term goal is to improve pain, function, and quality of life with minimally invasive procedures.    Patient was instructed to return to clinic with any new or worsening symptoms.    Patient education was provided.  Patient verbalizes understanding.    All questions and concerns addressed today.    Return for In Person Visit after #2/2 Cervical MBNB procedure.    Fuller Plan, FNP-C     Portions of this note may be dictated using voice recognition software or a dictation service. Variances in spelling and vocabulary are possible and unintentional. Not all errors are caught/corrected. Please notify the [facility] if any discrepancies are noted or if the meaning of any statement is not clear.

## 2023-03-08 NOTE — Nursing Note (Signed)
Follow up  Had bilateral cervical mbnb c3,c4,c5 on 02/10/23 by Dr Selena Batten.Had relief for the first day. States pain is back to baseline.    Current pain rated: 6/10. Best: 2/10. Worst: 9/10.    Neck pain described as constant aching, dull aching, and stabbing. Extends down right arm down to fingers.  Eases with Positioning.   Aggravated by Activity.    Taking Ibuprofen - providing very little pain relief.  JMcBride, RN          03/08/2023   NECK DISABILITY INDEX   Pain Intensity 2   Personal Care (Washing, Dressing, etc.) 1   Lifting 3   Reading 3   Headaches 5   Concentration 1   Work 3   Driving 3   Sleeping 3   Recreation 3   Score 27   Neck Disability Percentage 54        ,       03/08/2023   Conservative Therapies   Type of Injury Personal   Date of personal injury 12/08/21   Has the patient participated in Physical Therapy? No   Has the patient participated in Chiropractic Manipulation? Yes   Dates of chiropractic manipulation 2023   Actively participating in chiropractic manipulation No   Any relief with chiropractic manipulation No   Has the patient participated in Home Exercise? Yes   Dates of home exercise 2024   Actively participating in home exercise Yes   Any relief with home exercise Yes   Additional Previous Treatments None   Previous Medications NSAIDS;Acetaminophen;Muscle relaxants   Surgical Eval Yes   Surgeon seen and date Dr. Sherryll Burger in 2024   Was surgery recommended? No

## 2023-03-28 ENCOUNTER — Encounter (HOSPITAL_BASED_OUTPATIENT_CLINIC_OR_DEPARTMENT_OTHER)
Admission: RE | Disposition: A | Discharge status: Home / Self Care | Payer: Self-pay | Source: Ambulatory Visit | Attending: PAIN MANAGEMENT

## 2023-03-28 ENCOUNTER — Encounter (HOSPITAL_BASED_OUTPATIENT_CLINIC_OR_DEPARTMENT_OTHER): Payer: MEDICAID | Admitting: PAIN MANAGEMENT

## 2023-03-28 ENCOUNTER — Other Ambulatory Visit (HOSPITAL_COMMUNITY): Payer: MEDICAID

## 2023-03-28 ENCOUNTER — Other Ambulatory Visit: Payer: Self-pay

## 2023-03-28 ENCOUNTER — Encounter (HOSPITAL_BASED_OUTPATIENT_CLINIC_OR_DEPARTMENT_OTHER): Payer: Self-pay | Admitting: PAIN MANAGEMENT

## 2023-03-28 ENCOUNTER — Ambulatory Visit
Admission: RE | Admit: 2023-03-28 | Discharge: 2023-03-28 | Disposition: A | Discharge status: Home / Self Care | Payer: MEDICAID | Source: Ambulatory Visit | Attending: PAIN MANAGEMENT | Admitting: PAIN MANAGEMENT

## 2023-03-28 DIAGNOSIS — M47812 Spondylosis without myelopathy or radiculopathy, cervical region: Secondary | ICD-10-CM | POA: Insufficient documentation

## 2023-03-28 DIAGNOSIS — G894 Chronic pain syndrome: Secondary | ICD-10-CM | POA: Insufficient documentation

## 2023-03-28 HISTORY — PX: HX NERVE BLOCK: 210380035

## 2023-03-28 SURGERY — PAIN SERVICE INJECTION FACET CERVICAL/THORACIC
Anesthesia: Local (Nurse-Monitored) | Site: Neck | Laterality: Bilateral | Wound class: Clean Wound: Uninfected operative wounds in which no inflammation occurred

## 2023-03-28 MED ORDER — LIDOCAINE HCL 20 MG/ML (2 %) INJECTION SOLUTION
Freq: Once | INTRAMUSCULAR | Status: DC | PRN
Start: 2023-03-28 — End: 2023-03-28
  Administered 2023-03-28: 6 mL via INTRAMUSCULAR

## 2023-03-28 MED ORDER — DIAZEPAM 5 MG TABLET
5.0000 mg | ORAL_TABLET | Freq: Four times a day (QID) | ORAL | Status: DC | PRN
Start: 2023-03-28 — End: 2023-03-28
  Administered 2023-03-28: 5 mg via ORAL

## 2023-03-28 MED ORDER — BUPIVACAINE (PF) 0.5 % (5 MG/ML) INJECTION SOLUTION
Freq: Once | INTRAMUSCULAR | Status: DC | PRN
Start: 2023-03-28 — End: 2023-03-28
  Administered 2023-03-28: 3 mL via INTRAMUSCULAR

## 2023-03-28 MED ORDER — LIDOCAINE HCL 10 MG/ML (1 %) INJECTION SOLUTION
Freq: Once | INTRAMUSCULAR | Status: DC | PRN
Start: 2023-03-28 — End: 2023-03-28
  Administered 2023-03-28: 20 mL via INTRAMUSCULAR

## 2023-03-28 NOTE — Nurses Notes (Signed)
Ice pack applied to injection sites. VSS. Pt denies any questions or concerns. Pt ambulated out with staff.

## 2023-03-28 NOTE — OR Surgeon (Signed)
OPERATIVE NOTE    Patient Name: [name]  Age:  42 y.o.  Sex:  male  MRN:  [id number]  Date of Service: 03/28/2023  Date of Birth: 1980/11/08    Attending Surgeon: [name], MD  Assistant(s):Circulator: Janelle Floor, RN  Scrub Person: Grafton Folk, ST  X-Ray Technologist: Gilberto Better, RT (R)     Pre-Operative Diagnosis: Spondylosis of cervical region without myelopathy or radiculopathy, Chronic Pain Syndrome   Post-Operative Diagnosis: Spondylosis of cervical region without myelopathy or radiculopathy, Chronic Pain Syndrome  Procedure: Procedure(s):  BILATERAL C3, C4, C5 MBNB TO TREAT C3-4, C4-5- 2/2  .    Anesthesia: Local (Nurse-Monitored), No anesthesia staff entered.   Specimens/ Cultures: * No specimens in log *   Implants: * No implants in log *   Estimated Blood Loss: Minimal  Complications: None (Not routinely expected or not inherent to difficulty/nature of procedure)    Description of Procedure:  Procedure performed:  Diagnostic bilateral C3, C4, C5  cervical medial branch block at C3-4 and C4-5  level under fluoroscopy    Indications:  Patient reported better than 80% pain relief after 1st diagnostic block.  Patient is here for 2nd of the 2 diagnostic medial branch block to improve pain and function.      Description of procedure:  Following providing a detailed description of the intended procedure, including the risk and benefit and alternative options,  patient was taken to the procedure room and was placed in the prone position on the fluoroscopy table.  Routine monitors were applied.  The posterior aspect of the cervical spine was prepped and draped in sterile condition.  Under fluoroscopy guidance cervical thoracic spine was visualized and the ordered levels on both side of the cervical spine was visualized and located.  Each area was infiltrated with 3 cc of 1% lidocaine.  A 25 gauge spinal needle was introduced at each level until the  tip of needle was placed in the medial portion of the lamina for facet and joint medial branch block.  After negative aspiration for blood, CSF, or air a total of  1 cc of 2% Lidocaine was injected  at each level without a problem.  The patient tolerated the procedure well.  There was no complication.  The patient was taken to recovery room for 15 minutes observation.  The patient was discharged in satisfactory condition.     discharge instruction:  The patient was told to place ice over the injection site for next 30 minutes on 3 separate occasions.  The patient was instructed to watch for possible side effects or adverse reactions and to contact us if there is any problem.  Patient was also instructed to go to the emergency room if there is any chest pain, breathing difficulty or neurological deficit.     Disposition: Post Anesthesia Care Unit  Condition: Stable    [name], MD

## 2023-03-28 NOTE — Discharge Instructions (Signed)
Post Care Injection Instructions    Apply ice to the procedure site for 30 minute intervals.   Do this on at least 3 occasions: do not exceed 30 minutes at a time.  Continue ice and ibuprofen for up to 3 days if site pain continues.   You may remove band-aids after one hour.   Normal activity may resume the following day as able.  You may take medications prescribed by the physicians as needed for pain.  Do not drive for 12 hours following the procedure.  No baths for 24 hours following the procedure, but can shower.  You may experience some numbness or weakness for the next 12 hours. Please be careful walking and standing.  Notify your physician if redness or swelling persists, if fever develops, if the wound has drainage, or a rash develops.   If you have and further questions please ask the nurse or a staff member for assistance prior to check-out or call the [facility] at [phone].

## 2023-03-28 NOTE — H&P (Signed)
H&P UPDATE FORM                                                                                  [name], [name], 42 y.o. male  Date of Admission:  03/28/2023  Date of Birth:  Feb 12, 1981    03/28/2023    STOP: IF H&P IS GREATER THAN 30 DAYS FROM SURGICAL DAY COMPLETE NEW H&P IS REQUIRED.     H & P updated the day of the procedure.  1.  H&P completed within 30 days of surgical procedure  and has been reviewed within 24 hours of the surgery, the patient has been examined, and no change has occured in the patients condition since the H&P was completed.       Change in medications: No     No LMP for male patient.      Comments:     2.  Patient continues to be appropriate candidate for planned surgical procedure. YES    Jeffrey Lopes, MD

## 2023-04-07 ENCOUNTER — Encounter (HOSPITAL_BASED_OUTPATIENT_CLINIC_OR_DEPARTMENT_OTHER): Payer: Self-pay

## 2023-04-11 ENCOUNTER — Encounter (HOSPITAL_BASED_OUTPATIENT_CLINIC_OR_DEPARTMENT_OTHER): Payer: Self-pay

## 2023-04-11 ENCOUNTER — Ambulatory Visit: Payer: MEDICAID

## 2023-04-11 ENCOUNTER — Other Ambulatory Visit: Payer: Self-pay

## 2023-04-11 DIAGNOSIS — M47812 Spondylosis without myelopathy or radiculopathy, cervical region: Secondary | ICD-10-CM | POA: Insufficient documentation

## 2023-04-11 DIAGNOSIS — M542 Cervicalgia: Secondary | ICD-10-CM | POA: Insufficient documentation

## 2023-04-11 DIAGNOSIS — M5030 Other cervical disc degeneration, unspecified cervical region: Secondary | ICD-10-CM | POA: Insufficient documentation

## 2023-04-11 DIAGNOSIS — G894 Chronic pain syndrome: Secondary | ICD-10-CM | POA: Insufficient documentation

## 2023-04-11 NOTE — H&P (Signed)
PAIN MANAGEMENT, [facility]  [address]  [place] [address]  Operated by [facility]  History and Physical    Name: [name] MRN:  [id number]   Date: 04/11/2023 DOB:  Jul 03, 1980 (42 y.o.)               Provider: Fuller Plan, FNP-C  PCP: Yancey Flemings, FNP-BC  Referring Provider: Yancey Flemings     Reason for visit: Neck Pain (Follow up after injections)      Objective:  Nursing Notes:   Molly Maduro, MA  04/11/23 0900  Signed  Pt is here for a follow up after injections    Pt is having neck pain  Pain level is 7 current 10 worst 3 lowest  Pain is describe as burning , aching a pressure pain and stabbing  Pain increases with looking down and turning his neck and any activity  Pain decreases with rest and heat    Pt recently had Diagnostic bilateral C3, C4, C5 cervical medial branch block at C3-4 and C4-5 level under fluoroscopy 2/2 on 03/28/23  Pt states the injection did    Pt is a Dr Selena Batten patient help        04/11/2023   NECK DISABILITY INDEX   Pain Intensity 3   Personal Care (Washing, Dressing, etc.) 1   Lifting 1   Reading 2   Headaches 5   Concentration 2   Work 3   Driving 3   Sleeping 4   Recreation 3   Score 27   Neck Disability Percentage 54        ,       03/08/2023   Conservative Therapies   Type of Injury Personal   Date of personal injury 12/08/21   Has the patient participated in Physical Therapy? No   Has the patient participated in Chiropractic Manipulation? Yes   Dates of chiropractic manipulation 2023   Actively participating in chiropractic manipulation No   Any relief with chiropractic manipulation No   Has the patient participated in Home Exercise? Yes   Dates of home exercise 2024   Actively participating in home exercise Yes   Any relief with home exercise Yes   Additional Previous Treatments None   Previous Medications NSAIDS;Acetaminophen;Muscle relaxants   Surgical Eval Yes   Surgeon seen and date Dr. Sherryll Burger in 2024   Was surgery  recommended? No        ,       01/11/2023     2:00 PM 04/11/2023     8:00 AM   Opioid Risk Assessment   Family History Illegal Drug Abuse 0 0   Family History of Prescription Drug Abuse 0 0   Personal History of Illegal Drug Use 0 0   Personal History Prescription Drug Abuse 0 0   Age 30-38 years old 1 1   Depression Diagnosis 1 0   Total Score 2 1            HPI:  Pt here for follow up appt.  Patient following up after BILATERAL C3, C4, C5 Cervical MBNB (treating C3-4, C4-5 facet levels) on 03/17/2023.  Reports at least 90-95% of relief after procedure.  Pt reports continued relief lasting about 24 hours before pain started to return.  Patient reports improvement in pain, function, and ability to complete ADL's without severe pain after interventional therapy.  This is pt's 2nd set of injections.  The first set being done  on 02/10/2023, where pt reported 100% of relief lasting "rest of the day".     Discussed with pt that the next step moving forward is to have an RFA for longer term relief.  Explained to pt the risks, benefits, complications, and alternatives.  The alternative always available - doing nothing.  At a minimum, any procedure carries the risks of bleeding, infection, and nerve injury. Some procedures may carry more risk.      Pt verbalizes understanding and is agreeable to continue with this tx plan.     Denies any other acute changes.  No new numbness, tingling, weakness, or radicular pain at this time.  Discussed differing pain generators along with treatments of each and expectation thereof.  Also discussed inability to provide guarantee of any interventional treatment offered, given the subjectivity, individuality, and multifactorial components of chronic pain.          Past Medical History:  Past Medical History:   Diagnosis Date    GERD (gastroesophageal reflux disease)      Past Surgical History:   Procedure Laterality Date    ABDOMINAL SURGERY      HX HERNIA REPAIR      HX NERVE BLOCK Bilateral  03/28/2023    BILATERAL C3, C4, C5 MBNB TO TREAT C3-4, C4-5- 2/2      No Known Allergies  Current Outpatient Medications   Medication Sig    ADDERALL 10 mg Oral Tablet     azelastine (ASTELIN) 137 mcg (0.1 %) Nasal Aerosol, Spray Administer 2 Sprays into each nostril Once a day Use in each nostril as directed    cetirizine (ZYRTEC) 10 mg Oral Tablet Take 1 Tablet (10 mg total) by mouth Once a day    montelukast (SINGULAIR) 10 mg Oral Tablet Take 1 Tablet (10 mg total) by mouth Once a day    ondansetron (ZOFRAN ODT) 4 mg Oral Tablet, Rapid Dissolve Take 1 Tablet (4 mg total) by mouth Every 8 hours as needed for Nausea/Vomiting    pantoprazole (PROTONIX) 40 mg Oral Tablet, Delayed Release (E.C.) Take 1 Tablet (40 mg total) by mouth Twice daily     Family Medical History:    None        Social History     Socioeconomic History    Marital status: Divorced   Tobacco Use    Smoking status: Former     Types: Cigarettes    Smokeless tobacco: Never   Vaping Use    Vaping status: Never Used   Substance and Sexual Activity    Alcohol use: Yes     Comment: occasionally    Drug use: Never       Physical Exam:  Vital Signs:  BP (!) 132/94 (Site: Left Wrist, Patient Position: Sitting, Cuff Size: Adult)   Pulse 71   Ht 1.778 m (5\' 10" )   Wt 70.3 kg (155 lb)   BMI 22.24 kg/m         Physical Exam  Vitals and nursing note reviewed.   Constitutional:       Appearance: Normal appearance.   Musculoskeletal:      Cervical back: Spasms, tenderness and bony tenderness present. Pain with movement, spinous process tenderness and muscular tenderness present. Decreased range of motion.      Thoracic back: Normal.      Lumbar back: Normal.   Neurological:      Mental Status: He is alert and oriented to person, place, and time.  Sensory: Sensation is intact.      Motor: Motor function is intact.      Coordination: Coordination is intact.      Gait: Gait is intact.   Psychiatric:         Attention and Perception: Attention normal.          Mood and Affect: Mood and affect normal.         Speech: Speech normal.         Behavior: Behavior normal. Behavior is cooperative.         Thought Content: Thought content normal.         Cognition and Memory: Cognition normal.         Assessment:    ICD-10-CM    1. 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 Neck pain  M54.2 CASE REQUEST SURGICAL: PAIN SERVICE RHIZOTOMY CERVICAL/THORACIC, PAIN SERVICE RHIZOTOMY CERVICAL/THORACIC ADDITIONAL LEVELS           Plan:  Orders Placed This Encounter    CASE REQUEST SURGICAL: PAIN SERVICE RHIZOTOMY CERVICAL/THORACIC, PAIN SERVICE RHIZOTOMY CERVICAL/THORACIC ADDITIONAL LEVELS     Order:  BILATERAL C3, C4, C5 Cervical RFA (treating C3-4, C4-5 facet levels) under fluoroscopic guidance with Dr. Selena Batten.   Follow up after procedure.    No other needs voiced at this time.    Patient's pain and function are stable at this time.  Continue to monitor progress from procedure(s).  Continue with current treatment plan.  Can repeat procedure(s) in the future, as needed.  Encourage ROM, exercise, and activity as tolerated.    Continues a directed HEP without significant improvement long-term, but this in conjunction with other multimodal and interventional strategies prove to be beneficial in maintaining pain and function.    Long-term goal is to improve pain, function, and quality of life with minimally invasive procedures.    Patient was instructed to return to clinic with any new or worsening symptoms.    Patient education was provided.  Patient verbalizes understanding.    All questions and concerns addressed today.    Return for In  Person Visit after Bilateral Cervical RFA procedure.    Fuller Plan, FNP-C     Portions of this note may be dictated using voice recognition software or a dictation service. Variances in spelling and vocabulary are possible and unintentional. Not all errors are caught/corrected. Please notify the [facility] if any discrepancies are noted or if the meaning of any statement is not clear.

## 2023-04-11 NOTE — Nursing Note (Signed)
Pt is here for a follow up after injections    Pt is having neck pain  Pain level is 7 current 10 worst 3 lowest  Pain is describe as burning , aching a pressure pain and stabbing  Pain increases with looking down and turning his neck and any activity  Pain decreases with rest and heat    Pt recently had Diagnostic bilateral C3, C4, C5 cervical medial branch block at C3-4 and C4-5 level under fluoroscopy 2/2 on 03/28/23  Pt states the injection did    Pt is a Dr Selena Batten patient help        04/11/2023   NECK DISABILITY INDEX   Pain Intensity 3   Personal Care (Washing, Dressing, etc.) 1   Lifting 1   Reading 2   Headaches 5   Concentration 2   Work 3   Driving 3   Sleeping 4   Recreation 3   Score 27   Neck Disability Percentage 54        ,       03/08/2023   Conservative Therapies   Type of Injury Personal   Date of personal injury 12/08/21   Has the patient participated in Physical Therapy? No   Has the patient participated in Chiropractic Manipulation? Yes   Dates of chiropractic manipulation 2023   Actively participating in chiropractic manipulation No   Any relief with chiropractic manipulation No   Has the patient participated in Home Exercise? Yes   Dates of home exercise 2024   Actively participating in home exercise Yes   Any relief with home exercise Yes   Additional Previous Treatments None   Previous Medications NSAIDS;Acetaminophen;Muscle relaxants   Surgical Eval Yes   Surgeon seen and date Dr. Sherryll Burger in 2024   Was surgery recommended? No        ,       01/11/2023     2:00 PM 04/11/2023     8:00 AM   Opioid Risk Assessment   Family History Illegal Drug Abuse 0 0   Family History of Prescription Drug Abuse 0 0   Personal History of Illegal Drug Use 0 0   Personal History Prescription Drug Abuse 0 0   Age 14-38 years old 1 1   Depression Diagnosis 1 0   Total Score 2 1

## 2023-05-01 ENCOUNTER — Encounter (HOSPITAL_BASED_OUTPATIENT_CLINIC_OR_DEPARTMENT_OTHER): Payer: Self-pay | Admitting: PAIN MANAGEMENT

## 2023-05-01 ENCOUNTER — Encounter (HOSPITAL_BASED_OUTPATIENT_CLINIC_OR_DEPARTMENT_OTHER)
Admission: RE | Disposition: A | Discharge status: Home / Self Care | Payer: Self-pay | Source: Ambulatory Visit | Attending: PAIN MANAGEMENT

## 2023-05-01 ENCOUNTER — Inpatient Hospital Stay
Admission: RE | Admit: 2023-05-01 | Discharge: 2023-05-01 | Disposition: A | Discharge status: Home / Self Care | Payer: MEDICAID | Source: Ambulatory Visit | Attending: PAIN MANAGEMENT | Admitting: PAIN MANAGEMENT

## 2023-05-01 ENCOUNTER — Other Ambulatory Visit (HOSPITAL_COMMUNITY): Payer: MEDICAID

## 2023-05-01 ENCOUNTER — Other Ambulatory Visit: Payer: Self-pay

## 2023-05-01 ENCOUNTER — Encounter (HOSPITAL_BASED_OUTPATIENT_CLINIC_OR_DEPARTMENT_OTHER): Payer: MEDICAID | Admitting: PAIN MANAGEMENT

## 2023-05-01 DIAGNOSIS — M542 Cervicalgia: Secondary | ICD-10-CM | POA: Insufficient documentation

## 2023-05-01 DIAGNOSIS — M47812 Spondylosis without myelopathy or radiculopathy, cervical region: Secondary | ICD-10-CM | POA: Insufficient documentation

## 2023-05-01 DIAGNOSIS — G894 Chronic pain syndrome: Secondary | ICD-10-CM | POA: Insufficient documentation

## 2023-05-01 HISTORY — PX: RADIOFREQUENCY ABLATION: SHX2290

## 2023-05-01 SURGERY — PAIN SERVICE RHIZOTOMY CERVICAL/THORACIC
Anesthesia: Local (Nurse-Monitored) | Laterality: Bilateral | Wound class: Clean Wound: Uninfected operative wounds in which no inflammation occurred

## 2023-05-01 MED ORDER — DEXAMETHASONE SODIUM PHOSPHATE 4 MG/ML INJECTION SOLUTION
Freq: Once | INTRAMUSCULAR | Status: DC | PRN
Start: 2023-05-01 — End: 2023-05-01
  Administered 2023-05-01: 4 mg via INTRAMUSCULAR

## 2023-05-01 MED ORDER — LIDOCAINE HCL 10 MG/ML (1 %) INJECTION SOLUTION
Freq: Once | INTRAMUSCULAR | Status: DC | PRN
Start: 2023-05-01 — End: 2023-05-01
  Administered 2023-05-01: 30 mL via INTRADERMAL

## 2023-05-01 MED ORDER — DIAZEPAM 10 MG TABLET
10.0000 mg | ORAL_TABLET | Freq: Once | ORAL | Status: AC
Start: 2023-05-01 — End: 2023-05-01
  Administered 2023-05-01: 10 mg via ORAL

## 2023-05-01 NOTE — Nurses Notes (Signed)
Ice pack applied to injection sites. VSS. Pt denies any questions or concerns. Pt ambulated out with staff.

## 2023-05-01 NOTE — Discharge Instructions (Signed)
Post Care Injection Instructions    Apply ice to the procedure site for 30 minute intervals for the rest of the day.   Do not exceed 30 minutes at a time.  Do not use a heating pad on injection site for 2 weeks.  Continue ice and ibuprofen for up to 3 days if site pain continues.   You may remove band-aids after one hour.   Normal activity may resume the following day as able.  You may take medications prescribed by the physicians as needed for pain.  Do not drive for 12 hours following the procedure.  No baths for 24 hours following the procedure, but can shower.  You may experience some numbness or weakness for the next 12 hours. Please be careful walking and standing.  Notify your physician if redness or swelling persists, if fever develops, if the wound has drainage, or a rash develops.   If you have and further questions please ask the nurse or a staff member for assistance prior to check-out or call the [facility] at [phone].

## 2023-05-01 NOTE — OR Surgeon (Signed)
OPERATIVE NOTE    Patient Name: [name]  Age:  42 y.o.  Sex:  male  MRN:  [id number]  Date of Service: 05/01/2023  Date of Birth: 1980-10-13    Attending Surgeon: [name], MD  Assistant(s):Circulator: Rachel Bo, RN  Scrub Person: Grafton Folk, ST  X-Ray Technologist: Cristal Generous, RT (R)     Pre-Operative Diagnosis: Cervical spondylosis without myelopathy, Chronic Pain Syndrome   Post-Operative Diagnosis: Cervical spondylosis without myelopathy, Chronic Pain Syndrome  Procedure: Procedure(s):  BILATERAL C3, C4, C5 RFA TO TREAT C3-4, C4-5  .    Anesthesia: Local (Nurse-Monitored), No anesthesia staff entered.   Specimens/ Cultures: * No specimens in log *   Implants: * No implants in log *   Estimated Blood Loss: Minimal  Complications: None (Not routinely expected or not inherent to difficulty/nature of procedure)    Description of Procedure:  Procedure performed:  Radiofrequency ablation of bilateral cervical C3,C4, C5 medial branches at  C3-4 and C4-5 level under fluoroscopy    Medical necessity:  Patient has a history of chronic intractable neck pain with pain radiating into the upper extremity secondary to severe cervical spinal disease.  Patient had an excellent pain relief (better than 80% pain relief) with the diagnostic cervical facet joint injections the past, however, pain has returned to baseline.  Patient brought today for radiofrequency ablation of cervical facet joints to improve pain and function and provide with long-term pain relief.    Description procedure:  After detail informed consent was obtained including infection bleeding and other complication and allergic reaction to the medication using the procedure patient brought to the operating room and was placed in prone position.  Routine monitors were placed.  The skin overlying the area of the cervical spine was widely prepped and draped in sterile condition.  Under fluoroscopy  guidance cervical spine was visualized and the medial branches were located in the mid portion of the lamina on both side of the cervical spine.  Skin was infiltrated to reduce 1% lidocaine with epi at each level.  Then 22 gauge spine radiofrequency ablation needles  at each level until tip of needle was placed over the mid-portion of the lamina along the medial branches of cervical facet joints.  Needle position was confirmed with AP and lateral fluoroscopy.  After negative aspiration of the needles were stimulated at 2 hertz for motor at 50 hertz with sensory without any problem.  The lesion was made 80 C for 90 seconds at each level without any problems.  Patient denied having any pain or sensation radiating into the arm and hand during the procedure.  Patient tolerated the procedure well.  There was no complication.  Patient taken to recovery for 15 minutes observation.  Patient was discharged in satisfactory condition.     Disposition: Post Anesthesia Care Unit  Condition: Stable    [name], MD

## 2023-05-01 NOTE — H&P (Signed)
H&P UPDATE FORM                                                                                  [name], [name], 42 y.o. male  Date of Admission:  05/01/2023  Date of Birth:  1980/10/02    05/01/2023    STOP: IF H&P IS GREATER THAN 30 DAYS FROM SURGICAL DAY COMPLETE NEW H&P IS REQUIRED.     H & P updated the day of the procedure.  1.  H&P completed within 30 days of surgical procedure  and has been reviewed within 24 hours of the surgery, the patient has been examined, and no change has occured in the patients condition since the H&P was completed.       Change in medications: No     No LMP for male patient.      Comments:     2.  Patient continues to be appropriate candidate for planned surgical procedure. YES    Jeffrey Lopes, MD

## 2023-05-19 ENCOUNTER — Other Ambulatory Visit: Payer: Self-pay

## 2023-05-19 ENCOUNTER — Encounter (HOSPITAL_BASED_OUTPATIENT_CLINIC_OR_DEPARTMENT_OTHER): Payer: Self-pay

## 2023-05-19 ENCOUNTER — Ambulatory Visit: Payer: MEDICAID

## 2023-05-19 DIAGNOSIS — M47812 Spondylosis without myelopathy or radiculopathy, cervical region: Secondary | ICD-10-CM | POA: Insufficient documentation

## 2023-05-19 DIAGNOSIS — G894 Chronic pain syndrome: Secondary | ICD-10-CM | POA: Insufficient documentation

## 2023-05-19 DIAGNOSIS — M542 Cervicalgia: Secondary | ICD-10-CM | POA: Insufficient documentation

## 2023-05-19 DIAGNOSIS — M5030 Other cervical disc degeneration, unspecified cervical region: Secondary | ICD-10-CM | POA: Insufficient documentation

## 2023-05-19 MED ORDER — LIDOCAINE 5 % TOPICAL OINTMENT
TOPICAL_OINTMENT | Freq: Three times a day (TID) | CUTANEOUS | 1 refills | Status: AC | PRN
Start: 2023-05-19 — End: ?

## 2023-05-19 MED ORDER — METHYLPREDNISOLONE 4 MG TABLETS IN A DOSE PACK
ORAL_TABLET | ORAL | 1 refills | Status: DC
Start: 2023-05-19 — End: 2023-06-15

## 2023-05-19 NOTE — H&P (Signed)
PAIN MANAGEMENT, [facility]  [address]  [place] [address]  Operated by [facility]  History and Physical    Name: [name] MRN:  [id number]   Date: 05/19/2023 DOB:  1980-06-17 (43 y.o.)               Provider: Fuller Plan, FNP-C  PCP: Yancey Flemings, FNP-BC  Referring Provider: Yancey Flemings     Reason for visit: Follow Up (Bilateral C3, C4, C5 RFA)      Objective:  Nursing Notes:   Maurice Small, RN  05/19/23 1258  Signed  Here today for a FUV to bilateral C3, C4, C5 RFA on 05/01/23 with Dr. Selena Batten.   He reports that he had relief for a few days after the procedure, however, He states that after that, he is having difficulty working and has not worked for two and a half weeks, due to not being able to lift medium to heavy items, turning his neck to either side, and when leaning his head back.     He states that his pain feels worse than it did before the RFA.     Current pain: 8, Best: 6, Worst: 10    Neck pain described as constant. He reports a "hot poker," sensation at the injection sites that extends into bilateral shoulders. He also reports a "stinging, aching, and burning," sensation starting between the shoulder blades, "on the spine," and states that the pain extends up into the neck and down into the lower back.       Patient is taking Tylenol PRN and he states that the medication provides no pain relief      Patient states that OTC pain medication, using heat/ice application on the back and neck is not giving him any relief.      Pain is aggravated by any movement throughout the day- he reports that the pain increases throughout the day and is worst at night. This includes, turning head to either side, and up and down. He reports that he has difficulty getting dressed in the AM due to the pain, and states that this is significantly impacting his day to day tasks.     He reports that he was not able to lift a five gallon can of diesel yesterday and  heard/felt a "cracking" sensation in his back when attempting to lift it.     Patient reports numbness in fingers of bilateral hands and numbness in toes.       Maurice Small, RN            05/19/2023   NECK DISABILITY INDEX   Pain Intensity 4   Personal Care (Washing, Dressing, etc.) 2   Lifting 5   Reading 3   Headaches 0   Concentration 2   Work 5   Driving 4   Sleeping 5   Recreation 5   Score 35   Neck Disability Percentage 70        ,       05/19/2023   Conservative Therapies   Type of Injury Personal   Date of personal injury 12/08/21   Has the patient participated in Physical Therapy? No   Has the patient participated in Chiropractic Manipulation? Yes   Dates of chiropractic manipulation 2023   Actively participating in chiropractic manipulation No   Any relief with chiropractic manipulation No   Has the patient participated in Home Exercise? Yes   Dates of home exercise  2024   Actively participating in home exercise Yes   Any relief with home exercise Yes   Additional Previous Treatments Nerve Block   Previous Medications NSAIDS;Acetaminophen;Muscle relaxants   Surgical Eval Yes   Surgeon seen and date Dr. Sherryll Burger in 2024   Was surgery recommended? No          HPI:  Pt here for follow up appt.  Patient following up after BILATERAL C3, C4, C5 Cervical RFA (treating C3-4, C4-5 facet levels) on 05/01/2023.  Reports minimal relief following procedure.  Pt reports increased pain in neck and shoulders.  Patient reports little to no improvement in pain, function, and ability to complete ADL's without severe pain after interventional therapy.    Explained to pt that it can take up to 8 weeks before seeing full benefits of RFA.  Pt is about 2.5 week(s) post-procedure, so he could potentially see continued relief over the next few weeks.  Also informed pt that RFA can be repeated every 6 months, if needed.      I explained to pt that he may be having s/s of neuritis after procedure.  Will prescribe pt Lidocaine topical and a  Medrol Dose pack.  Also instructed pt to continue using ice packs.   Pt verbalizes understanding and is agreeable to continue with this tx plan.    Will have pt follow up in 4 weeks to reevaluate Cervical RFA and if there have been any improvements.     Denies any other acute changes.  No new numbness, tingling, weakness, or radicular pain at this time.  Discussed differing pain generators along with treatments of each and expectation thereof.  Also discussed inability to provide guarantee of any interventional treatment offered, given the subjectivity, individuality, and multifactorial components of chronic pain.          Past Medical History:  Past Medical History:   Diagnosis Date    GERD (gastroesophageal reflux disease)      Past Surgical History:   Procedure Laterality Date    ABDOMINAL SURGERY      HX HERNIA REPAIR      HX NERVE BLOCK Bilateral 03/28/2023    BILATERAL C3, C4, C5 MBNB TO TREAT C3-4, C4-5- 2/2      No Known Allergies  Current Outpatient Medications   Medication Sig    ADDERALL 10 mg Oral Tablet Take 1 Tablet (10 mg total) by mouth Once a day    azelastine (ASTELIN) 137 mcg (0.1 %) Nasal Aerosol, Spray Administer 2 Sprays into each nostril Once a day Use in each nostril as directed    cetirizine (ZYRTEC) 10 mg Oral Tablet Take 1 Tablet (10 mg total) by mouth Once a day    lidocaine (XYLOCAINE) 5 % Ointment Apply topically Three times a day as needed    montelukast (SINGULAIR) 10 mg Oral Tablet Take 1 Tablet (10 mg total) by mouth Once a day    ondansetron (ZOFRAN ODT) 4 mg Oral Tablet, Rapid Dissolve Take 1 Tablet (4 mg total) by mouth Every 8 hours as needed for Nausea/Vomiting    pantoprazole (PROTONIX) 40 mg Oral Tablet, Delayed Release (E.C.) Take 1 Tablet (40 mg total) by mouth Twice daily     Family Medical History:    None        Social History     Socioeconomic History    Marital status: Divorced   Tobacco Use    Smoking status: Former     Types: Cigarettes  Smokeless tobacco: Never    Vaping Use    Vaping status: Never Used   Substance and Sexual Activity    Alcohol use: Yes     Comment: occasionally    Drug use: Never       Physical Exam:  Vital Signs:  BP (!) 140/92   Pulse 83   Ht 1.778 m (5\' 10" )   Wt 72.6 kg (160 lb)   BMI 22.96 kg/m         Physical Exam  Vitals and nursing note reviewed.   Constitutional:       Appearance: Normal appearance.   Musculoskeletal:      Cervical back: Spasms, tenderness and bony tenderness present. Pain with movement, spinous process tenderness and muscular tenderness present. Decreased range of motion.      Thoracic back: Normal.      Lumbar back: Normal.   Neurological:      Mental Status: He is alert and oriented to person, place, and time.      Sensory: Sensation is intact.      Motor: Motor function is intact.      Coordination: Coordination is intact.      Gait: Gait is intact.   Psychiatric:         Attention and Perception: Attention normal.         Mood and Affect: Mood and affect normal.         Speech: Speech normal.         Behavior: Behavior normal. Behavior is cooperative.         Thought Content: Thought content normal.         Cognition and Memory: Cognition normal.         Assessment:    ICD-10-CM    1. Chronic pain syndrome  G89.4       2. Cervical spondylosis without myelopathy  M47.812       3. 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 Neck pain  M54.2            Plan:  Orders Placed This Encounter    lidocaine (XYLOCAINE) 5 % Ointment     Prescribing Lidocaine 5% topical and Medrol Dose Pack.    Follow up in 4 weeks to re-evaluate s/s neuritis and efficacy of Cervical RFA procedure.    No other needs voiced at this time.    Patient's pain and function are stable at this time.  Continue to monitor progress from procedure(s).  Continue with current treatment plan.  Can repeat procedure(s) in the future, as needed.  Encourage ROM, exercise, and activity as tolerated.    Continues a directed HEP without significant  improvement long-term, but this in conjunction with other multimodal and interventional strategies prove to be beneficial in maintaining pain and function.    Long-term goal is to improve pain, function, and quality of life with minimally invasive procedures.    Patient was instructed to return to clinic with any new or worsening symptoms.    Patient education was provided.  Patient verbalizes understanding.    All questions and concerns addressed today.    Return in about 4 weeks (around 06/16/2023) for In Person or Telephone Visit to re-evaluate Cervical RFA and if need for intervention.    Fuller Plan, FNP-C     Portions of this note may be dictated using voice recognition software or a dictation service. Variances in spelling and vocabulary are possible and  unintentional. Not all errors are caught/corrected. Please notify the [facility] if any discrepancies are noted or if the meaning of any statement is not clear.

## 2023-05-19 NOTE — Nursing Note (Signed)
Here today for a FUV to bilateral C3, C4, C5 RFA on 05/01/23 with Dr. Selena Batten.   He reports that he had relief for a few days after the procedure, however, He states that after that, he is having difficulty working and has not worked for two and a half weeks, due to not being able to lift medium to heavy items, turning his neck to either side, and when leaning his head back.     He states that his pain feels worse than it did before the RFA.     Current pain: 8, Best: 6, Worst: 10    Neck pain described as constant. He reports a "hot poker," sensation at the injection sites that extends into bilateral shoulders. He also reports a "stinging, aching, and burning," sensation starting between the shoulder blades, "on the spine," and states that the pain extends up into the neck and down into the lower back.       Patient is taking Tylenol PRN and he states that the medication provides no pain relief      Patient states that OTC pain medication, using heat/ice application on the back and neck is not giving him any relief.      Pain is aggravated by any movement throughout the day- he reports that the pain increases throughout the day and is worst at night. This includes, turning head to either side, and up and down. He reports that he has difficulty getting dressed in the AM due to the pain, and states that this is significantly impacting his day to day tasks.     He reports that he was not able to lift a five gallon can of diesel yesterday and heard/felt a "cracking" sensation in his back when attempting to lift it.     Patient reports numbness in fingers of bilateral hands and numbness in toes.       Maurice Small, RN            05/19/2023   NECK DISABILITY INDEX   Pain Intensity 4   Personal Care (Washing, Dressing, etc.) 2   Lifting 5   Reading 3   Headaches 0   Concentration 2   Work 5   Driving 4   Sleeping 5   Recreation 5   Score 35   Neck Disability Percentage 70        ,       05/19/2023   Conservative Therapies   Type  of Injury Personal   Date of personal injury 12/08/21   Has the patient participated in Physical Therapy? No   Has the patient participated in Chiropractic Manipulation? Yes   Dates of chiropractic manipulation 2023   Actively participating in chiropractic manipulation No   Any relief with chiropractic manipulation No   Has the patient participated in Home Exercise? Yes   Dates of home exercise 2024   Actively participating in home exercise Yes   Any relief with home exercise Yes   Additional Previous Treatments Nerve Block   Previous Medications NSAIDS;Acetaminophen;Muscle relaxants   Surgical Eval Yes   Surgeon seen and date Dr. Sherryll Burger in 2024   Was surgery recommended? No

## 2023-05-26 ENCOUNTER — Ambulatory Visit (INDEPENDENT_AMBULATORY_CARE_PROVIDER_SITE_OTHER): Payer: Self-pay | Admitting: Student in an Organized Health Care Education/Training Program

## 2023-06-15 ENCOUNTER — Ambulatory Visit: Payer: MEDICAID

## 2023-06-15 ENCOUNTER — Encounter (HOSPITAL_BASED_OUTPATIENT_CLINIC_OR_DEPARTMENT_OTHER): Payer: Self-pay

## 2023-06-15 ENCOUNTER — Other Ambulatory Visit: Payer: Self-pay

## 2023-06-15 DIAGNOSIS — M5030 Other cervical disc degeneration, unspecified cervical region: Secondary | ICD-10-CM | POA: Insufficient documentation

## 2023-06-15 DIAGNOSIS — M47812 Spondylosis without myelopathy or radiculopathy, cervical region: Secondary | ICD-10-CM | POA: Insufficient documentation

## 2023-06-15 DIAGNOSIS — M542 Cervicalgia: Secondary | ICD-10-CM | POA: Insufficient documentation

## 2023-06-15 DIAGNOSIS — G894 Chronic pain syndrome: Secondary | ICD-10-CM | POA: Insufficient documentation

## 2023-06-15 DIAGNOSIS — M5412 Radiculopathy, cervical region: Secondary | ICD-10-CM | POA: Insufficient documentation
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
## 2023-06-21 ENCOUNTER — Ambulatory Visit: Admission: RE | Admit: 2023-06-21 | Payer: MEDICAID | Source: Ambulatory Visit | Admitting: PAIN MANAGEMENT

## 2023-06-21 DIAGNOSIS — M5412 Radiculopathy, cervical region: Secondary | ICD-10-CM | POA: Insufficient documentation

## 2023-06-21 DIAGNOSIS — G894 Chronic pain syndrome: Secondary | ICD-10-CM | POA: Insufficient documentation

## 2023-07-06 ENCOUNTER — Telehealth (HOSPITAL_BASED_OUTPATIENT_CLINIC_OR_DEPARTMENT_OTHER): Payer: Self-pay | Admitting: PAIN MANAGEMENT

## 2023-07-06 NOTE — Telephone Encounter (Signed)
Patient called and left a message with triage that  he is having increased neck pain from the ablation and the epidural that was ordered has been denied due to it does not show he has pain that travels down into the arms and hands with numbness tingling and weakness I will message Fuller Plan the ordering provider and see if she can do an addendum note because the patient stated that was what he was having at that visit before I call the patient back

## 2023-07-06 NOTE — Telephone Encounter (Signed)
 Thank you so much

## 2023-07-11 NOTE — Telephone Encounter (Signed)
 Placed a message to the authorization team to see if the addendum can be sent over for resubmission or if we have to do an appeal

## 2023-07-11 NOTE — Telephone Encounter (Signed)
 Please check on this I had Jeffrey Battiest do an addendum to her note and authorization stated it was denied so an appeal would need done

## 2023-07-18 ENCOUNTER — Telehealth (HOSPITAL_BASED_OUTPATIENT_CLINIC_OR_DEPARTMENT_OTHER): Payer: Self-pay | Admitting: PAIN MANAGEMENT

## 2023-07-18 NOTE — Telephone Encounter (Signed)
 Patient called and left a message with triage again wanting to know if we have heard anything back regarding the requested Cervical ESI so when i was able to return his call I let him know that [place] corrected her note and everything has been sent to Stark Bray RN for the appeal process we are just waiting back to hear from his insurance   Patient states the pain is electric like comes from the neck all the way into his low back even feels like a hot poker stabbing  and this has become debilitating and has gotten worse since the ablation he still has the headaches his hands and fingers go numb cannot sleep in his bed sleeps in a recliner and extreme temperatures hot and cold both will set things off even he is frustrated and does not know what else he can do

## 2023-08-02 ENCOUNTER — Encounter (INDEPENDENT_AMBULATORY_CARE_PROVIDER_SITE_OTHER): Payer: Self-pay | Admitting: Physician Assistant

## 2023-08-02 ENCOUNTER — Ambulatory Visit (INDEPENDENT_AMBULATORY_CARE_PROVIDER_SITE_OTHER): Payer: MEDICAID | Admitting: Physician Assistant

## 2023-08-02 ENCOUNTER — Other Ambulatory Visit: Payer: Self-pay

## 2023-08-02 VITALS — BP 156/99 | HR 72 | Ht 70.0 in | Wt 151.4 lb

## 2023-08-02 DIAGNOSIS — Z3009 Encounter for other general counseling and advice on contraception: Secondary | ICD-10-CM

## 2023-08-02 NOTE — Progress Notes (Signed)
 [facility]OLOGY, [facility]  [address] [place] [address]    Progress Note    Name: [name] MRN:  [id number]   Date: 08/02/2023 DOB:  [birth year] (42 y.o.)             Chief Complaint: New Patient and Vasectomy Consult  Subjective   Subjective:   [name] is a pleasant 43 y.o. White male whom presents to the clinic for vasectomy consult. Patient with 2 biological children, divorced, very happy and feels family planning complete.Patient denies personal or family history of urinary malignancy.Patient denies occupational chemical exposure.Patient denies history of tobacco use.Patient denies fevers, chills, nausea, vomiting, hematuria, dysuria, flank pain, incontinence, dribbling, hesitancy, suprapubic pain, headaches, vision changes, shortness of breath, chest pain.         Objective   Objective :  BP (!) 156/99 (Site: Right Arm, Patient Position: Sitting, Cuff Size: Adult)   Pulse 72   Ht 1.778 m (5\' 10" )   Wt 68.7 kg (151 lb 6.4 oz)   BMI 21.72 kg/m       Gen: NAD, alert  Pulm: unlabored at rest  CV: palpable pulses  Abd: soft, Nt/ND  GU: no suprapubic tenderness, no CVAT    Data reviewed:    Current Outpatient Medications   Medication Sig    ADDERALL 10 mg Oral Tablet Take 1 Tablet (10 mg total) by mouth Once a day    azelastine (ASTELIN) 137 mcg (0.1 %) Nasal Aerosol, Spray Administer 2 Sprays into each nostril Once a day Use in each nostril as directed    cetirizine (ZYRTEC) 10 mg Oral Tablet Take 1 Tablet (10 mg total) by mouth Once a day    lidocaine (XYLOCAINE) 5 % Ointment Apply topically Three times a day as needed    montelukast (SINGULAIR) 10 mg Oral Tablet Take 1 Tablet (10 mg total) by mouth Once a day    ondansetron (ZOFRAN ODT) 4 mg Oral Tablet, Rapid Dissolve Take 1 Tablet (4 mg total) by mouth Every 8 hours as needed for Nausea/Vomiting    pantoprazole (PROTONIX) 40 mg Oral Tablet, Delayed Release (E.C.) Take 1 Tablet (40 mg total) by mouth Twice daily           Urine POCT         Assessment/Plan  Problem List Items Addressed This Visit    None    Elective sterilization:  Indications and risks and benefits of elective sterilization using a vasectomy was discussed with the patient.  All his questions were answered.  I reviewed the possible risks of bleeding, hematoma, infection, damage to adjacent structures.  He verbalizes understanding of these risks.  He will require a semen analysis 3 months after surgery with greater than 30 ejaculations in that time.  Patient verbalizes understanding that he will not be deemed sterile until a negative semen analysis is obtained.      Jeffrey Arenas, PA-C       A combined total of  20 minutes were spent preparing to see the patient, reviewing previous records, ordering tests/medications/procedures, documenting the clinical encounter as well as performing a medically appropriate evaluation and independently interpreting results and communicating them to the patient/family/caregiver as specifically outlined above in the impression and plan.

## 2023-08-10 ENCOUNTER — Telehealth (HOSPITAL_BASED_OUTPATIENT_CLINIC_OR_DEPARTMENT_OTHER): Payer: Self-pay | Admitting: PAIN MANAGEMENT

## 2023-08-10 NOTE — Telephone Encounter (Signed)
 08/10/23  13:52  I noticed [name], NP had prescribed an oral steroid in January.  I called [name] to verify that he took the prescribed medication. He said he did and it provided temporary relief.  I also asked if he tried to appeal on his own at any time and he said he did not. I asked if he received the denial from his insurance and he confirmed that he did. I told him his insurance provided appeal information in that letter and he had the option to appeal on own behalf. I also notified him that his insurance, [employer]	[place] [employer] [facility], requires a signed member consent (something in writing) prior to review of any request we would submit. He was notified I will try to obtain the member consent form and I could either mail it, e-mail it or fax it to him. He said he could just fill it out when he comes for his appt on Tuesday (08/15/23).    Winston Hawking, RN
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
## 2023-08-10 NOTE — Telephone Encounter (Addendum)
 Received correspondence from patient advocate that pt was upset due to no update on Cervical ESI appeal.  Appears he spoke with triage nurse on 07/18/23, I spoke with denial specialist and she was notified of denial last week and is still processing appeal. His other concern was related to medical records, offered their direct number.    I called patient, he has had increased pain since cervical RFA-which was discussed with Grafton Lawrence, NP. He reports increase of headaches, shoulder pain, temperature changes (feeling cold) in fingers intermittently which is affecting his work-he is self employed and unable to take time off work.  Veronica NP had ordered a cervical ESI to treat symptoms but was denied by insurance.  I did apologize for delay in updates and denial specialist will contact him once appeal is returned.  I encouraged him to call with any questions or concerns as he is his own advocate.  Due to his worsening symptoms, appointment offered with Dr. Burdette Carolin for evaluation and he did accept.  Appt made for 08/15/23 at 12:00 pm.

## 2023-08-14 NOTE — Telephone Encounter (Signed)
 08/14/23  Since I don't have a signed member consent yet, I sent a request to [facility] asking them to resubmit the auth request for cervical ESI's since the patient hasn't had these done. Advised they could review my 03/27 note as well as [name]'s note and his last clinic note from [name], NP.  Awaiting response. I feel this would be beneficial since I don't have the signed member consent that [employer] requires in order to process an appeal and the appeal process could take up to 45 days     Arzella Laurence, Charity fundraiser

## 2023-08-15 ENCOUNTER — Ambulatory Visit: Payer: MEDICAID | Attending: PAIN MANAGEMENT | Admitting: PAIN MANAGEMENT

## 2023-08-15 ENCOUNTER — Encounter (HOSPITAL_BASED_OUTPATIENT_CLINIC_OR_DEPARTMENT_OTHER): Payer: Self-pay | Admitting: PAIN MANAGEMENT

## 2023-08-15 ENCOUNTER — Other Ambulatory Visit: Payer: Self-pay

## 2023-08-15 VITALS — BP 140/90 | HR 62 | Ht 70.0 in | Wt 151.0 lb

## 2023-08-15 DIAGNOSIS — G894 Chronic pain syndrome: Secondary | ICD-10-CM | POA: Insufficient documentation

## 2023-08-15 DIAGNOSIS — M5412 Radiculopathy, cervical region: Secondary | ICD-10-CM | POA: Insufficient documentation

## 2023-08-15 DIAGNOSIS — M47812 Spondylosis without myelopathy or radiculopathy, cervical region: Secondary | ICD-10-CM | POA: Insufficient documentation

## 2023-08-15 MED ORDER — METHYLPREDNISOLONE 4 MG TABLETS IN A DOSE PACK
ORAL_TABLET | ORAL | 0 refills | Status: DC
Start: 2023-08-15 — End: 2023-11-22

## 2023-08-15 NOTE — H&P (Signed)
 PAIN MANAGEMENT, [facility] BUILDING WEST  [address]  [place] [address]  Operated by [facility]  History and Physical    Name: [name] MRN:  [id number]   Date: 08/15/2023 DOB:  06/26/1980 (43 y.o.)               Provider: Rollin Clock, MD  PCP: Adelaida Holts, FNP-BC  Referring Provider: Adelaida Holts     Reason for visit: Neck Pain (Current pain level 7, Best 7 & Worst 10./Describes the pain as constant throbbing & aching.)      Objective:  Nursing Notes:   Lugene Sahara, RN  08/15/23 1212  Signed  Here today for increased pain.  Cervical ESI was denied. Michelle B. Working on the appeal.  Patient had a bilateral cervical RFA C3,C4,C5 RFA on 05/01/23.  Patient states he didn't receive any relief from the RFA. States the RFA made the pain worse.  Patient c/o neck pain which extends into the back of the head to the eyes, both shoulders, shoulder blades, mid back & upper arms. Numbness & tingling in both arms & hands. Reports having daily headaches  Worse with prolonged sitting, standing, turning head side to side & looking up.  Better with moist heat.        08/15/2023   NECK DISABILITY INDEX   Pain Intensity 3   Personal Care (Washing, Dressing, etc.) 1   Lifting 2   Reading 3   Headaches 4   Concentration 3   Work 3   Driving 3   Sleeping 4   Recreation 3   Score 29   Neck Disability Percentage 58     ,       08/15/2023   Conservative Therapies   Type of Injury Personal   Date of personal injury 12/08/21   Has the patient participated in Physical Therapy? No   Has the patient participated in Chiropractic Manipulation? Yes   Dates of chiropractic manipulation 2023   Actively participating in chiropractic manipulation No   Any relief with chiropractic manipulation No   Has the patient participated in Home Exercise? Yes   Dates of home exercise 2024   Actively participating in home exercise Yes   Any relief with home exercise Yes   Additional Previous Treatments Nerve Block    Previous Medications NSAIDS;Acetaminophen ;Muscle relaxants   Surgical Eval Yes   Surgeon seen and date Dr. Mason Sole in 2024   Was surgery recommended? No               HPI:    Neck Pain         Past Medical History:  Past Medical History:   Diagnosis Date    GERD (gastroesophageal reflux disease)      Past Surgical History:   Procedure Laterality Date    ABDOMINAL SURGERY      HX HERNIA REPAIR      HX NERVE BLOCK Bilateral 03/28/2023    BILATERAL C3, C4, C5 MBNB TO TREAT C3-4, C4-5- 2/2    LAPAROSCOPIC CHOLECYSTECTOMY      LITHOTRIPSY      RADIOFREQUENCY ABLATION Bilateral 05/01/2023    C3,C4,C5 RFA    URETERAL STENT PLACEMENT        No Known Allergies  Current Outpatient Medications   Medication Sig    ADDERALL 10 mg Oral Tablet Take 1 Tablet (10 mg total) by mouth Daily    azelastine  (ASTELIN ) 137 mcg (0.1 %) Nasal  Aerosol, Spray Administer 2 Sprays into each nostril Once a day Use in each nostril as directed    cetirizine  (ZYRTEC ) 10 mg Oral Tablet Take 1 Tablet (10 mg total) by mouth Once a day    lidocaine  (XYLOCAINE ) 5 % Ointment Apply topically Three times a day as needed    montelukast  (SINGULAIR ) 10 mg Oral Tablet Take 1 Tablet (10 mg total) by mouth Once a day    ondansetron  (ZOFRAN  ODT) 4 mg Oral Tablet, Rapid Dissolve Take 1 Tablet (4 mg total) by mouth Every 8 hours as needed for Nausea/Vomiting    pantoprazole (PROTONIX) 40 mg Oral Tablet, Delayed Release (E.C.) Take 1 Tablet (40 mg total) by mouth Twice daily     Family Medical History:    None        Social History     Socioeconomic History    Marital status: Divorced   Tobacco Use    Smoking status: Former     Types: Cigarettes    Smokeless tobacco: Never   Vaping Use    Vaping status: Never Used   Substance and Sexual Activity    Alcohol use: Yes     Comment: occasionally    Drug use: Never       Physical Exam:  Vital Signs:  BP (!) 140/90   Pulse 62   Ht 1.778 m (5\' 10" )   Wt 68.5 kg (151 lb)   BMI 21.67 kg/m         Physical Exam  Vitals and  nursing note reviewed.   Constitutional:       Appearance: Normal appearance.   HENT:      Head: Normocephalic and atraumatic.   Musculoskeletal:      Cervical back: Tenderness and bony tenderness present. No spasms. Pain with movement, spinous process tenderness and muscular tenderness present. Decreased range of motion.      Comments: Cervical spine muscle is soft.    Sensory exam is diminished to light touch in right hand  Strength in bilateral shoulder elbows and hands are with a normal limits   Neurological:      Mental Status: He is alert.   Psychiatric:         Mood and Affect: Mood normal.         Behavior: Behavior normal.         Assessment:    ICD-10-CM    1. Cervical radiculitis  M54.12       2. Chronic pain syndrome  G89.4       3. 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    Patient presents today with chronic intractable neck pain.  Pain is reproducible with turning of the neck from side to side and looking up.  Patient also reports spasms in the neck and shoulder region.  Patient reports that radiofrequency ablation cervical medial branches made his pain worse.  He also complains of numbness and tingling sensation radiating into both upper extremity right side worse than left.  Nursing note also noted that he has pain and numbness became worse after lifting a 5 pump bucket of paint.  He was seen by a nurse practitioner who gave him a prescription of Medrol  Dosepak and lidocaine  cream which helped him temporarily.  Cervical epidural steroid injection for symptoms of cervical radiculitis was ordered but denied by his insurance.  Patient came  in today complaining of poor health care for my office.  He complains than nobody returned his phone call until he reached patient advocate at [place].  He also reports that he was not notified of his denial for cervical epidural steroid injection until he contact my  office.  Cervical medial branch block and RFA was recommended because he was having more complaint of neck pain with a headache after being hit in the had by lumber.  I explained to him that he might have whiplash type of injury causing neck pain and headache.  Patient had already seen by a neurosurgeon who did not recommend surgery for bulging disc in his cervical spine.  Since patient is complaining of worsening neck pain with numbness and tingling radiating into upper extremities my recommendation repeat MRI of cervical spine and for for him back to Neurosurgery for further evaluation including possible surgical intervention.  I will prescribe Medrol  Dosepak since it helped him in the past.  Patient denies any new injury or trauma.  Patient denies any new neurological symptom changes suggestive bladder or bowel dysfunction.  Patient to call back for appointment if the pain level changes for worse.  I will discuss physical therapy, injection therapy or medication changes if necessary.  Patient will call the office if there is any questions or other new problems.  Long-term goal is to improve pain, function, and quality of life with minimally invasive procedures.    Return if symptoms worsen or fail to improve.    Rollin Clock, MD     Portions of this note may be dictated using voice recognition software or a dictation service. Variances in spelling and vocabulary are possible and unintentional. Not all errors are caught/corrected. Please notify the [facility] if any discrepancies are noted or if the meaning of any statement is not clear.

## 2023-08-15 NOTE — Nursing Note (Signed)
 Here today for increased pain.  Cervical ESI was denied. Michelle B. Working on the appeal.  Patient had a bilateral cervical RFA C3,C4,C5 RFA on 05/01/23.  Patient states he didn't receive any relief from the RFA. States the RFA made the pain worse.  Patient c/o neck pain which extends into the back of the head to the eyes, both shoulders, shoulder blades, mid back & upper arms. Numbness & tingling in both arms & hands. Reports having daily headaches  Worse with prolonged sitting, standing, turning head side to side & looking up.  Better with moist heat.        08/15/2023   NECK DISABILITY INDEX   Pain Intensity 3   Personal Care (Washing, Dressing, etc.) 1   Lifting 2   Reading 3   Headaches 4   Concentration 3   Work 3   Driving 3   Sleeping 4   Recreation 3   Score 29   Neck Disability Percentage 58     ,       08/15/2023   Conservative Therapies   Type of Injury Personal   Date of personal injury 12/08/21   Has the patient participated in Physical Therapy? No   Has the patient participated in Chiropractic Manipulation? Yes   Dates of chiropractic manipulation 2023   Actively participating in chiropractic manipulation No   Any relief with chiropractic manipulation No   Has the patient participated in Home Exercise? Yes   Dates of home exercise 2024   Actively participating in home exercise Yes   Any relief with home exercise Yes   Additional Previous Treatments Nerve Block   Previous Medications NSAIDS;Acetaminophen ;Muscle relaxants   Surgical Eval Yes   Surgeon seen and date Dr. Mason Sole in 2024   Was surgery recommended? No

## 2023-08-30 ENCOUNTER — Other Ambulatory Visit: Payer: Self-pay

## 2023-09-01 ENCOUNTER — Other Ambulatory Visit: Payer: Self-pay

## 2023-09-01 ENCOUNTER — Ambulatory Visit
Admission: RE | Admit: 2023-09-01 | Discharge: 2023-09-01 | Disposition: A | Discharge status: Home / Self Care | Payer: MEDICAID | Source: Ambulatory Visit | Attending: Family | Admitting: Family

## 2023-09-01 DIAGNOSIS — M47812 Spondylosis without myelopathy or radiculopathy, cervical region: Secondary | ICD-10-CM | POA: Insufficient documentation

## 2023-09-01 DIAGNOSIS — M5412 Radiculopathy, cervical region: Secondary | ICD-10-CM | POA: Insufficient documentation

## 2023-09-01 DIAGNOSIS — M4722 Other spondylosis with radiculopathy, cervical region: Secondary | ICD-10-CM

## 2023-09-01 DIAGNOSIS — G894 Chronic pain syndrome: Secondary | ICD-10-CM | POA: Insufficient documentation

## 2023-09-04 ENCOUNTER — Encounter (INDEPENDENT_AMBULATORY_CARE_PROVIDER_SITE_OTHER): Payer: Self-pay

## 2023-09-04 ENCOUNTER — Ambulatory Visit: Payer: MEDICAID

## 2023-09-04 ENCOUNTER — Other Ambulatory Visit: Payer: Self-pay

## 2023-09-04 DIAGNOSIS — M47812 Spondylosis without myelopathy or radiculopathy, cervical region: Secondary | ICD-10-CM | POA: Insufficient documentation

## 2023-09-04 DIAGNOSIS — M5412 Radiculopathy, cervical region: Secondary | ICD-10-CM | POA: Insufficient documentation

## 2023-09-04 DIAGNOSIS — M542 Cervicalgia: Secondary | ICD-10-CM

## 2023-09-04 DIAGNOSIS — G894 Chronic pain syndrome: Secondary | ICD-10-CM | POA: Insufficient documentation

## 2023-09-04 NOTE — H&P (Signed)
 NEUROSURGERY, MEDICAL OFFICE BUILDING WEST  [address]  [place] [address]  Operated by [facility]    Name: [name] MRN:  [id number]   Date: 09/04/2023 DOB:  Oct 01, 1980 (43 y.o.)     Chief Complaint:   Chief Complaint   Patient presents with    Follow Up     Patient is a 43 year old male here today for a follow up with Dr. Mason Sole- was unhappy with the care received from [facility]. Patient rates his pain a 10/10. Patient denies previous spine surgeries. Patient's imaging was completed at [facility]. Patient denies hardware in the body.        HPI:  43 year old gentleman known to me.  He previously had presented to me for cervical pain.  Since then he has continued to have cervical pain.  He has had an updated MRI.  He is here to discuss the results of the dated MRI.  I also discussed with him his current symptoms.  He feels like his symptoms are unchanged.  He does not describe any new symptoms.  He does not describe any pain that radiates into his upper limbs in a dermatomal pattern.  He does not note any gait abnormalities.  He denies any bowel or bladder symptoms.  He denies any focal motor deficits.    ROS: complete 12 point review of systems performed findings noted in HPI    PMH:   Past Medical History:   Diagnosis Date    GERD (gastroesophageal reflux disease)           PSxH:   Past Surgical History:   Procedure Laterality Date    ABDOMINAL SURGERY      HX HERNIA REPAIR      HX NERVE BLOCK Bilateral 03/28/2023    BILATERAL C3, C4, C5 MBNB TO TREAT C3-4, C4-5- 2/2    LAPAROSCOPIC CHOLECYSTECTOMY      LITHOTRIPSY      RADIOFREQUENCY ABLATION Bilateral 05/01/2023    C3,C4,C5 RFA    URETERAL STENT PLACEMENT        SHX:   Social History     Socioeconomic History    Marital status: Divorced     Spouse name: Not on file    Number of children: Not on file    Years of education: Not on file    Highest education level: Not on file   Occupational History    Not on file   Tobacco Use     Smoking status: Former     Types: Cigarettes    Smokeless tobacco: Never   Vaping Use    Vaping status: Never Used   Substance and Sexual Activity    Alcohol use: Yes     Comment: occasionally    Drug use: Never    Sexual activity: Not on file   Other Topics Concern    Not on file   Social History Narrative    Not on file     Social Determinants of Health     Financial Resource Strain: Not on file   Transportation Needs: Not on file   Social Connections: Not on file   Intimate Partner Violence: Not on file   Housing Stability: Not on file      Social Determinates              PHYSICAL EXAM  Blood pressure 123/80, pulse 73, height 1.803 m (5\' 11" ), weight 68 kg (150 lb), SpO2 99%.   Orientation/General -  The patient is awake and alert, oriented x 3 following commands x 4. The speech was fluent and comprehensive. the patient is in no acute distress. the patient is appropriate appearing for their stated age   Respiratory: The patient is breathing normally on room air and has normal chest excursion with  no upper airway congestive noises. no lip pursing, no tachypnea  Psychiatric: Normal mood, appropriate affect and attention span and appropriate language   Orthopedic: Rotation of the hip joint does not reproduce groin pain and there is no tenderness over the trochanteric bursa.  Vascular/Cardiac: The patient has a normal dorsalis pedis pulse with a regular rate and rhythm  Skin: The skin turgor is normal with no abrasions or rash   Neurologic:   Lower EXT testing  Motor   right 5 hip flexion, 5knee extension, 5ankle dorsiflexion, 5ankle eversion, 5great toes extension, 5ankle planter flexion, 5hip  extension   left  5 hip flexion, 5knee extension, 5ankle dorsiflexion, 5ankle eversion, 5great toes extension, 5ankle planter flexion,5 hip extension     Reflex exam:  Right: 2+patella, 2+ achilles; brachioradialis2+ biceps2+; triceps 2+  Left: 2+ patella,2+achilles; brachioradialis 2+; biceps 2+; triceps2+     Sensory -  normal scratch, temperature and vibratory sensation in the right leg in the L3, L4, L5, S1 dermatomes  normal scratch, temperature and vibratory sensation in the left leg in the L3, L4, L5, S1 dermatomes straight leg raise testing did not reproduce your patients pain   Upper EXT testing  Motor -  Right 5  deltoid,  5 biceps,  5 extensor carpi radialis, 5 extensor digitorum, 5 flexor digitorum profundus I-IV, triceps.   Left 5 deltoid,5 biceps, 5 extensor carpi radialis, 5 extensor digitorum, 5 flexor digitorum  5 profundus I-IV, 5 triceps.    Sensory exam - There is normal sensation to temperature, scratch and vibration in the RIGHT C5, C6, C7, C8 dermatomes.  there is normal sensation to temperature, scratch and vibration in the LEFT C5, C6, C7, C8 dermatomes      Reflex exam  Right 2+biceps, 2+triceps, and 2+brachioradialis    LEFT - 2+biceps, 2+triceps, and 2+brachioradialis      Pathologic reflexes  Trommner's Present: no  Hoffman's  Present: no  Toes: downgoing  Clonus: absent at the ankle           Radiology/EMG/NCS/Lab Review:  MRI of the cervical spine obtained reviewed which demonstrates multilevel degenerative disc disease without evidence of nerve root compression or spinal cord compression.    Assessment and Plan:  43 year old gentleman who presents with axial neck pain as his dominant complaint.  I discussed with him my impressions that he continues to have a fairly benign looking study.     Follow up p.r.n.            I very much appreciate you giving me the opportunity to participate in [place] Arzola's care, kindest personal regards.     Leda Prude MD, FAANS   Associate Professor of Neurosurgery   [facility]          This note was partially generated using MModal Fluency Direct system, and there may be some incorrect words, spellings, and punctuation that were not noted in checking the note before saving.   Special Instructions

## 2023-09-15 ENCOUNTER — Encounter (HOSPITAL_BASED_OUTPATIENT_CLINIC_OR_DEPARTMENT_OTHER): Payer: Self-pay | Admitting: PAIN MANAGEMENT

## 2023-09-19 ENCOUNTER — Other Ambulatory Visit (HOSPITAL_BASED_OUTPATIENT_CLINIC_OR_DEPARTMENT_OTHER): Payer: Self-pay

## 2023-09-19 ENCOUNTER — Telehealth (HOSPITAL_COMMUNITY): Payer: Self-pay | Admitting: PAIN MANAGEMENT

## 2023-09-19 ENCOUNTER — Other Ambulatory Visit: Payer: Self-pay

## 2023-09-19 ENCOUNTER — Ambulatory Visit: Payer: MEDICAID | Attending: PAIN MANAGEMENT | Admitting: PAIN MANAGEMENT

## 2023-09-19 ENCOUNTER — Encounter (HOSPITAL_BASED_OUTPATIENT_CLINIC_OR_DEPARTMENT_OTHER): Payer: Self-pay | Admitting: PAIN MANAGEMENT

## 2023-09-19 VITALS — Ht 71.0 in | Wt 155.0 lb

## 2023-09-19 DIAGNOSIS — M5030 Other cervical disc degeneration, unspecified cervical region: Secondary | ICD-10-CM | POA: Insufficient documentation

## 2023-09-19 DIAGNOSIS — G894 Chronic pain syndrome: Secondary | ICD-10-CM | POA: Insufficient documentation

## 2023-09-19 NOTE — Nursing Note (Signed)
 Pt is here for to review C/S MRI (EPIC).  Pain radiating into bilateral arms. "My fingers going  numbness.  My fingers get white and they hurt."      Neck pain current 4 worst 10 lowest 4     Pain is describe as constant aching, stabbing, pressure, constant headache  Pain increase with lifting, sitting, walking  Pain decreases with supporting my head, hot towels, sitting in to recliner.    Pt is currently taking Tylenol      Pt is a Dr.Karcz patient          09/19/2023   NECK DISABILITY INDEX   Pain Intensity 3   Personal Care (Washing, Dressing, etc.) 1   Lifting 3   Reading 4   Headaches 5   Concentration 4   Work 4   Driving 3   Sleeping 4   Recreation 3   Score 34   Neck Disability Percentage 68     ,       09/19/2023   Conservative Therapies   Type of Injury Personal   Has the patient participated in Physical Therapy? No   Has the patient participated in Chiropractic Manipulation? Yes   Has the patient participated in Home Exercise? Yes   Additional Previous Treatments Nerve Block   Previous Medications NSAIDS;Acetaminophen ;Muscle relaxants   Surgical Eval Yes       Previous XRays:    Previous CTs:  Results for orders placed in visit on 01/12/23    CT CERVICAL SPINE WO IV CONTRAST      Previous MRIs  Results for orders placed during the hospital encounter of 09/01/23    MRI SPINE CERVICAL WO CONTRAST    Narrative  Morry A Asano    MRI SPINE CERVICAL WO CONTRAST performed on 09/01/2023 5:40 PM.    INDICATION:  M54.12: Cervical radiculitis  G89.4: Chronic pain syndrome  M47.812: Cervical spondylosis  Additional History:  Neck pain radiating to RUE with right hand numbness s/p injury x2 years ago    TECHNIQUE:  Multiplanar, multisequence MRI cervical spine performed without contrast.    COMPARISON: CT imaging May 27, 2022.    FINDINGS:  The prevertebral soft tissues are within normal limits.  The paraspinal soft tissues are unremarkable.    No acute fracture or subluxation. Vertebral body heights are preserved,  and facet joints are aligned.  No focal pathologic marrow lesion. Multilevel degenerative disc desiccation.    The posterior fossa is unremarkable.  Normal cord signal without evidence of syrinx.  No definite spinal canal mass.    Degenerative Changes:  C2/3: No significant stenosis.  C3/4: Uncovertebral spurring. Right-sided hypertrophy. Moderate right foraminal stenosis.  C4/5: Mild uncovertebral spurring. Mild bilateral foraminal narrowing.  C5/6: No significant stenosis.  C6/7: No significant stenosis.  C7/T1: No significant stenosis.    Impression  No acute fracture. Normal cord signal.  Very mild degenerative changes as above.  .  .  .  s/d/g        Radiologist location ID: [id number]      E.Dorwin Fitzhenry, RN

## 2023-09-19 NOTE — Telephone Encounter (Signed)
 Neurosurgery referral  faxed to Dr. Meyer Ada at [facility] [phone]  Fax number. [phone]    Defer for 1 day, CRO will follow up until scheduled.

## 2023-09-19 NOTE — H&P (Signed)
 PAIN MANAGEMENT, [facility]  [address]  [place] [address]  Operated by [facility]  History and Physical    Name: [name] MRN:  [id number]   Date: 09/19/2023 DOB:  [birth year] (43 y.o.)               Provider: Rollin Clock, MD  PCP: Adelaida Holts, FNP-BC  Referring Provider: No ref. provider found     Reason for visit: Neck Pain (Review C/S MRI)      Objective:  Nursing Notes:   Caresse Chant, RN  09/19/23 1241  Signed  Pt is here for to review C/S MRI (EPIC).  Pain radiating into bilateral arms. "My fingers going  numbness.  My fingers get white and they hurt."      Neck pain current 4 worst 10 lowest 4     Pain is describe as constant aching, stabbing, pressure, constant headache  Pain increase with lifting, sitting, walking  Pain decreases with supporting my head, hot towels, sitting in to recliner.    Pt is currently taking Tylenol      Pt is a Dr.Karcz patient          09/19/2023   NECK DISABILITY INDEX   Pain Intensity 3   Personal Care (Washing, Dressing, etc.) 1   Lifting 3   Reading 4   Headaches 5   Concentration 4   Work 4   Driving 3   Sleeping 4   Recreation 3   Score 34   Neck Disability Percentage 68     ,       09/19/2023   Conservative Therapies   Type of Injury Personal   Has the patient participated in Physical Therapy? No   Has the patient participated in Chiropractic Manipulation? Yes   Has the patient participated in Home Exercise? Yes   Additional Previous Treatments Nerve Block   Previous Medications NSAIDS;Acetaminophen ;Muscle relaxants   Surgical Eval Yes       Previous XRays:    Previous CTs:  Results for orders placed in visit on 01/12/23    CT CERVICAL SPINE WO IV CONTRAST      Previous MRIs  Results for orders placed during the hospital encounter of 09/01/23    MRI SPINE CERVICAL WO CONTRAST    Narrative  [name]    MRI SPINE CERVICAL WO CONTRAST performed on 09/01/2023 5:40 PM.    INDICATION:  M54.12: Cervical  radiculitis  G89.4: Chronic pain syndrome  M47.812: Cervical spondylosis  Additional History:  Neck pain radiating to RUE with right hand numbness s/p injury x2 years ago    TECHNIQUE:  Multiplanar, multisequence MRI cervical spine performed without contrast.    COMPARISON: CT imaging May 27, 2022.    FINDINGS:  The prevertebral soft tissues are within normal limits.  The paraspinal soft tissues are unremarkable.    No acute fracture or subluxation. Vertebral body heights are preserved, and facet joints are aligned.  No focal pathologic marrow lesion. Multilevel degenerative disc desiccation.    The posterior fossa is unremarkable.  Normal cord signal without evidence of syrinx.  No definite spinal canal mass.    Degenerative Changes:  C2/3: No significant stenosis.  C3/4: Uncovertebral spurring. Right-sided hypertrophy. Moderate right foraminal stenosis.  C4/5: Mild uncovertebral spurring. Mild bilateral foraminal narrowing.  C5/6: No significant stenosis.  C6/7: No significant stenosis.  C7/T1: No significant stenosis.    Impression  No acute fracture. Normal  cord signal.  Very mild degenerative changes as above.  .  .  .  s/d/g        Radiologist location ID: [id number]      E.Starcher, RN    HPI:    Neck Pain         Past Medical History:  Past Medical History:   Diagnosis Date    GERD (gastroesophageal reflux disease)      Past Surgical History:   Procedure Laterality Date    ABDOMINAL SURGERY      HX HERNIA REPAIR      HX NERVE BLOCK Bilateral 03/28/2023    BILATERAL C3, C4, C5 MBNB TO TREAT C3-4, C4-5- 2/2    LAPAROSCOPIC CHOLECYSTECTOMY      LITHOTRIPSY      RADIOFREQUENCY ABLATION Bilateral 05/01/2023    C3,C4,C5 RFA    URETERAL STENT PLACEMENT        No Known Allergies  Current Outpatient Medications   Medication Sig    ADDERALL 10 mg Oral Tablet Take 1 Tablet (10 mg total) by mouth Daily    azelastine  (ASTELIN ) 137 mcg (0.1 %) Nasal Aerosol, Spray Administer 2 Sprays into each nostril Once a day Use  in each nostril as directed    cetirizine  (ZYRTEC ) 10 mg Oral Tablet Take 1 Tablet (10 mg total) by mouth Once a day    lidocaine  (XYLOCAINE ) 5 % Ointment Apply topically Three times a day as needed    Methylprednisolone  (MEDROL  DOSEPACK) 4 mg Oral Tablets, Dose Pack Take as instructed.    montelukast  (SINGULAIR ) 10 mg Oral Tablet Take 1 Tablet (10 mg total) by mouth Once a day    ondansetron  (ZOFRAN  ODT) 4 mg Oral Tablet, Rapid Dissolve Take 1 Tablet (4 mg total) by mouth Every 8 hours as needed for Nausea/Vomiting    pantoprazole (PROTONIX) 40 mg Oral Tablet, Delayed Release (E.C.) Take 1 Tablet (40 mg total) by mouth Twice daily     Family Medical History:    None        Social History     Socioeconomic History    Marital status: Divorced   Tobacco Use    Smoking status: Former     Types: Cigarettes    Smokeless tobacco: Never   Vaping Use    Vaping status: Never Used   Substance and Sexual Activity    Alcohol use: Yes     Comment: occasionally    Drug use: Never       Physical Exam:  Vital Signs:  Ht 1.803 m (5\' 11" )   Wt 70.3 kg (155 lb)   BMI 21.62 kg/m         Physical Exam  Vitals and nursing note reviewed.   Constitutional:       Appearance: Normal appearance.   HENT:      Head: Normocephalic and atraumatic.   Musculoskeletal:      Cervical back: Spasms, tenderness and bony tenderness present. Pain with movement, spinous process tenderness and muscular tenderness present. Decreased range of motion.   Neurological:      Mental Status: He is alert.   Psychiatric:         Mood and Affect: Mood normal.         Behavior: Behavior normal.         Assessment:    ICD-10-CM    1. DDD (degenerative disc disease), cervical  M50.30 Referral to Provider - External      2. Chronic pain syndrome  G89.4            Plan:  Orders Placed This Encounter    Referral to Provider - External     Patient presents today with chronic intractable neck pain.  Pain is reproducible with turning of the neck from side to side and  looking up.  Patient also reports spasms in the neck and shoulder region.  Patient follow-up after neurosurgical consultation.  She was told by neurosurgeon that he does not have any surgical lesion.  MRI of cervical spine was reviewed which was essentially negative.    I also explained to him that he received cervical medial branch block because he complained of symptoms consistent with whiplash type of pain coming from cervical facet joints.  Patient underwent radiofrequency ablation for long-term relief without adequate pain relief.  Patient has been confrontational and belligerent to my office staff.   I told him that I will refer him to [facility] for further evaluation since nobody seems to know exactly why he is having the symptoms that he is having.  His pain has symptoms are lot higher and severe than would be expected from MRI and x-ray findings.  I told him that I did not have anything else to offer him.  I will not see him back in the office since he has been mean,  rude and obnoxious to my nursing staff.  He also threatened to sue our practice and the hospital because he did not get what he wanted.    Return if symptoms worsen or fail to improve.    Rollin Clock, MD     Portions of this note may be dictated using voice recognition software or a dictation service. Variances in spelling and vocabulary are possible and unintentional. Not all errors are caught/corrected. Please notify the [facility] if any discrepancies are noted or if the meaning of any statement is not clear.

## 2023-09-20 ENCOUNTER — Telehealth (HOSPITAL_COMMUNITY): Payer: Self-pay | Admitting: PAIN MANAGEMENT

## 2023-09-20 NOTE — Telephone Encounter (Signed)
 I contacted  Dr. Meyer Ada office at [facility] [phone], spoke with Avanell Bob, she stated that she did not see the referral as of yet, but that doesn't mean anything.  She stated that it usually takes 24-48 hours for the faxes to show up and The referral girls in Dr. Lorrane Rosette office are slow at scanning them in.  Lori asked that I call back tomorrow to check the status of the referral.     Defer 1 day, CRO will follow up until scheduled.

## 2023-09-20 NOTE — Telephone Encounter (Signed)
Patient is no longer seen in our office

## 2023-09-21 ENCOUNTER — Telehealth (HOSPITAL_COMMUNITY): Payer: Self-pay | Admitting: PAIN MANAGEMENT

## 2023-09-21 NOTE — Telephone Encounter (Signed)
 I contacted Dr. Meyer Ada office at [facility] [phone] spoke with Trevor Fudge, she stated that the referral was received, he is in the working que to be scheduled this week.  Trevor Fudge asked that we follow up Tuesday next week as the scheduler will be back that day.     Defer 3 days, CRO will follow up until scheduled.

## 2023-09-26 ENCOUNTER — Telehealth (HOSPITAL_COMMUNITY): Payer: Self-pay | Admitting: PAIN MANAGEMENT

## 2023-09-26 NOTE — Telephone Encounter (Signed)
 I contacted Dr. Meyer Ada office at [facility] [phone] spoke with Kellie, she stated that the referral was received, and is currently in review with the doctor.  Kellie stated it usually takes about 2 weeks for this, but Dr. Aloysius Janus was out most of last week so it may take a little long.  She asked that I follow up next week to see if appointment has been made.     Defer 7 days, CRO will follow up

## 2023-09-29 ENCOUNTER — Telehealth (HOSPITAL_COMMUNITY): Payer: Self-pay | Admitting: PAIN MANAGEMENT

## 2023-09-29 ENCOUNTER — Other Ambulatory Visit (HOSPITAL_BASED_OUTPATIENT_CLINIC_OR_DEPARTMENT_OTHER): Payer: Self-pay | Admitting: PAIN MANAGEMENT

## 2023-09-29 DIAGNOSIS — M5412 Radiculopathy, cervical region: Secondary | ICD-10-CM

## 2023-09-29 NOTE — Telephone Encounter (Signed)
 Sherry with Neurosurgery at [facility] called [phone] stating they are needing a new referral order since this one was noted to be sent to Neurosurgery at [facility].  Sent In Basket message to Dr. Burdette Carolin at Pain Mgmt with referral update. CRO is moving to close.

## 2023-10-05 ENCOUNTER — Other Ambulatory Visit (INDEPENDENT_AMBULATORY_CARE_PROVIDER_SITE_OTHER): Payer: Self-pay | Admitting: Physician Assistant

## 2023-10-05 ENCOUNTER — Telehealth (HOSPITAL_BASED_OUTPATIENT_CLINIC_OR_DEPARTMENT_OTHER): Payer: Self-pay | Admitting: PAIN MANAGEMENT

## 2023-10-05 DIAGNOSIS — Z3009 Encounter for other general counseling and advice on contraception: Secondary | ICD-10-CM

## 2023-10-05 NOTE — Telephone Encounter (Signed)
 Patient advocate reached out due to patient leaving her a voicemail concerning referral. Since she doesn't have access to medical chart she deferred question to me.  I called pt and updated him on referral to [facility]. He states he received a call shortly after leaving voicemail with patient advocate from [facility] so he was aware.  Pt was pleasant and call ended.

## 2023-10-06 ENCOUNTER — Encounter (HOSPITAL_BASED_OUTPATIENT_CLINIC_OR_DEPARTMENT_OTHER): Payer: Self-pay | Admitting: PAIN MANAGEMENT

## 2023-10-23 ENCOUNTER — Ambulatory Visit
Payer: MEDICAID | Attending: Student in an Organized Health Care Education/Training Program | Admitting: Student in an Organized Health Care Education/Training Program

## 2023-10-23 ENCOUNTER — Other Ambulatory Visit: Payer: Self-pay

## 2023-10-23 VITALS — BP 151/107 | HR 58 | Temp 97.9°F | Wt 153.1 lb

## 2023-10-23 DIAGNOSIS — M5412 Radiculopathy, cervical region: Secondary | ICD-10-CM | POA: Insufficient documentation

## 2023-10-23 DIAGNOSIS — G894 Chronic pain syndrome: Secondary | ICD-10-CM | POA: Insufficient documentation

## 2023-10-23 NOTE — H&P (Signed)
 [facility] Department of Neurosurgery      Operated by [facility]  History and Physical  Date:  10/23/2023  Name: [name]  MRN: [id number]  Age:  43 y.o.    Referring Physician: Rollin Clock, MD  [address]  [address]  [place],  [place] [address]    PCP:  Adelaida Holts, FNP-BC    CC:    Chief Complaint   Patient presents with    New Patient       History Obtained from:  Patient, Family, and medical chart    HPI: I had the pleasure of seeing [name] in the [facility] regarding neck pain. [name] is a 43 y.o male with a PMH of neck pain after a rick of lumbar dropped on him from 8 feet at [facility] in July of 2023. He states that when it dropped on him he lost consciousness. He states that his neck pain is tightness that extends along his neck and into his shoulders. He describes a hot poker sensation along his spine and sharp pain in his shoulder blades. He has continuous headaches that occur in the front of his head. He also states that he has numbness down both arms into his hands and his hands get cold all the time - worse in the winter time. He also describes a tingling/burning pain in his hands and arms.  He states that his grips strength is weaker and cannot lift as much due to the arm pain and neck pain. He takes Ibuprofen  800mg  helps but jsut dulls the pain. Uses heat that makes it feel better. He has not tried physical therapy. He saw Dr. Burdette Carolin with pain management and had an ablation on 05/01/23 with Dr. Burdette Carolin (that made his pain worse) on medial C3-C5 and medial branch block at C3-5 on 02/10/23 and 03/28/23 that only helped for a couple of hours. Patient states that he was seen by Dr. Rowan Cooter, a neurosurgeon at [facility] and was told that there was not any surgical intervention recommended on his neck at that time. 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 He does still work but it is  limited by what he can lift and move which limits his jobs that he can perform. He states that his neck pain has made him miserable. Patient has not tried physical therapy or muscle relaxants. He has seen a Land.       Review of Systems  Other than ROS in the HPI, all other systems were negative.    Past Medical History:    Past Medical History:   Diagnosis Date    GERD (gastroesophageal reflux disease)            Medications:   Outpatient Medications Marked as Taking for the 10/23/23 encounter (Office Visit) with Angelena Barber, MD   Medication Sig    ADDERALL 10 mg Oral Tablet Take 1 Tablet (10 mg total) by mouth Daily    azelastine  (ASTELIN ) 137 mcg (0.1 %) Nasal Aerosol, Spray Administer 2 Sprays into each nostril Once a day Use in each nostril as directed    cetirizine  (ZYRTEC ) 10 mg Oral Tablet Take 1 Tablet (10 mg total) by mouth Once a day    lidocaine  (XYLOCAINE ) 5 % Ointment Apply topically Three times a day as needed    montelukast  (SINGULAIR ) 10 mg Oral Tablet Take 1 Tablet (10 mg  total) by mouth Once a day    ondansetron  (ZOFRAN  ODT) 4 mg Oral Tablet, Rapid Dissolve Take 1 Tablet (4 mg total) by mouth Every 8 hours as needed for Nausea/Vomiting    pantoprazole (PROTONIX) 40 mg Oral Tablet, Delayed Release (E.C.) Take 1 Tablet (40 mg total) by mouth Twice daily       Allergies: Allergies[1]    Family History:   Family Medical History:    None           Surgical History:   Past Surgical History:   Procedure Laterality Date    ABDOMINAL SURGERY      HX HERNIA REPAIR      HX NERVE BLOCK Bilateral 03/28/2023    BILATERAL C3, C4, C5 MBNB TO TREAT C3-4, C4-5- 2/2    LAPAROSCOPIC CHOLECYSTECTOMY      LITHOTRIPSY      RADIOFREQUENCY ABLATION Bilateral 05/01/2023    C3,C4,C5 RFA    URETERAL STENT PLACEMENT             Social History:    Social History     Socioeconomic History    Marital status: Divorced   Tobacco Use    Smoking status: Former     Types: Cigarettes    Smokeless tobacco: Never   Vaping Use     Vaping status: Never Used   Substance and Sexual Activity    Alcohol use: Yes     Comment: occasionally    Drug use: Never         PHYSICAL EXAM:  Constitutional:  BP (!) 151/107   Pulse 58   Temp 36.6 C (97.9 F) (Thermal Scan)   Wt 69.4 kg (153 lb 1.6 oz)   SpO2 100%   BMI 21.35 kg/m       General:   Well developed  NAD  Eyes:   PERLLA  EOMI   ENT:   No otorrhea or rhinorrhea.   Overall, good dentition.   Psychiatric:  Attention, mood, and concentration all appropriate.   Integumentary:   no rashes or bruising.   Musculoskeletal:  Muscle strength (upper extremities):   -Bicep L 4+/5  -Bicep R 5/5  -Triceps R 5/5  -Triceps L 4+/5  Shoulder Adduction L 4/5  Shoulder Adduction R 5/5  Tenderness over spinous process in cervical spine and para spinal muscles - especially by shoulder blades  ROM neck range of motion tested against gravity - full range of motion - patient endorses tightness in his neck with all movements  Neurologic:   Mental Status: A & O x3, follows commands.   Knowledge: Appropriate  Language: Noaphasia  Speech: No dysarthria  Cranial Nerves:  2 No Visual Defect on Confrontation; Pupils round, equal, reactive to light  3,4,6Extraocular Movements Intact; no nystagmus  5 Facial Sensation Intact.   7 No facial asymmetry  8 Intact hearing  9,10 Palate symmetric  11 Good shoulder shrug  12 Tongue Midline  Gait- walks without assistance    Data reviewed  MRI cervical spine 09/01/23  The prevertebral soft tissues are within normal limits.  The paraspinal soft tissues are unremarkable.     No acute fracture or subluxation. Vertebral body heights are preserved, and facet joints are aligned.  No focal pathologic marrow lesion. Multilevel degenerative disc desiccation.     The posterior fossa is unremarkable.  Normal cord signal without evidence of syrinx.  No definite spinal canal mass.     Degenerative Changes:  C2/3: No significant stenosis.  C3/4:  Uncovertebral spurring. Right-sided hypertrophy.  Moderate right foraminal stenosis.  C4/5: Mild uncovertebral spurring. Mild bilateral foraminal narrowing.  C5/6: No significant stenosis.  C6/7: No significant stenosis.  C7/T1: No significant stenosis.     IMPRESSION:  No acute fracture. Normal cord signal.  Very mild degenerative changes as above    Assessment:      ICD-10-CM    1. Cervical radiculopathy  M54.12           Treatment Plan    MAVRIK BYNUM is a 43 y.o. male with a PMH of neck pain with bilateral arm numbness who presents to clinic as a new patient. His neck pain started in 2023 when heavy wood fell on top of his head at [facility]. He has neck pain, headaches, and numbness/tingling and pain down his bilateral arms. 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 His pain does make it hard for him to take jobs for work. He has tried, heat, over the counter medications, nerve ablation and injections without relief of his neck pain.   Discussed with the patient that he should be followed up by a neurologist for his neck and bilateral arm pain for evaluation and medication management. Placed neurology referral. Have ordered an EMG/ NCS for his bilateral upper extremities. His MRI was reviewed with Dr. Arlana Labor. He was previously evaluated by a neurosurgereon per patient and neck surgery was not recommended.   Ordered an MRI spine thoracic to evaluate his thoracic spine as he is having areas of pain around his shoulder blades.   Follow up in 8 weeks when testing is completed  Answered questions   The patient was seen as a shared visit with the co-signing faculty.        Ralph Burke, PA-C 10/23/2023, 13:44    I independently of the faculty provider spent a total of (45) minutes in direct/indirect care of this patient including initial evaluation, review of laboratory, radiology, diagnostic studies, review of medical record, patient/ family education, order entry and coordination of care.     With Dr. Arlana Labor     I spent a  total of (30) minutes in direct/indirect care of this patient including initial evaluation, review of laboratory, radiology, diagnostic studies, review of medical record, patient/ family education, order entry and coordination of care.    I personally saw and evaluated the patient. See mid-level's note for additional details. My findings/participation are that the patient is having pain and had seen [facility] colleagues earlier with no surgical intervention suggested. Will review after the eval to discuss further treatment options.    Angelena Barber, MD         [1] No Known Allergies

## 2023-11-10 ENCOUNTER — Encounter (INDEPENDENT_AMBULATORY_CARE_PROVIDER_SITE_OTHER): Payer: Self-pay

## 2023-11-22 ENCOUNTER — Ambulatory Visit: Payer: MEDICAID | Attending: Neurology | Admitting: Neurology

## 2023-11-22 ENCOUNTER — Other Ambulatory Visit: Payer: Self-pay

## 2023-11-22 ENCOUNTER — Encounter (INDEPENDENT_AMBULATORY_CARE_PROVIDER_SITE_OTHER): Payer: Self-pay | Admitting: Neurology

## 2023-11-22 VITALS — BP 140/92 | HR 74 | Ht 71.0 in | Wt 150.4 lb

## 2023-11-22 DIAGNOSIS — M5412 Radiculopathy, cervical region: Secondary | ICD-10-CM | POA: Insufficient documentation

## 2023-11-22 DIAGNOSIS — M7918 Myalgia, other site: Secondary | ICD-10-CM | POA: Insufficient documentation

## 2023-11-22 DIAGNOSIS — R202 Paresthesia of skin: Secondary | ICD-10-CM | POA: Insufficient documentation

## 2023-11-22 DIAGNOSIS — G4486 Cervicogenic headache: Secondary | ICD-10-CM | POA: Insufficient documentation

## 2023-11-22 DIAGNOSIS — G894 Chronic pain syndrome: Secondary | ICD-10-CM | POA: Insufficient documentation

## 2023-11-22 DIAGNOSIS — G43709 Chronic migraine without aura, not intractable, without status migrainosus: Secondary | ICD-10-CM | POA: Insufficient documentation

## 2023-11-22 MED ORDER — AMITRIPTYLINE 10 MG TABLET
ORAL_TABLET | ORAL | 3 refills | Status: DC
Start: 2023-11-22 — End: 2024-02-19

## 2023-11-22 MED ORDER — CYCLOBENZAPRINE 5 MG TABLET
ORAL_TABLET | ORAL | 5 refills | Status: AC
Start: 2023-11-22 — End: ?

## 2023-11-22 NOTE — H&P (Signed)
 OUTPATIENT HEADACHE MEDICINE CONSULTATION  [place] [facility]MENT OF NEUROLOGY           Date:  11/22/2023  Name: [name]  MRN: [id number]  Age:  43 y.o.  Referring Physician: Lauraine Lovett, PA-C    ?  Dear Dr. Lovett, thank you for the referral.  ?  History of Present Illness: ?  [name] is a 43 y.o. yo, right handed male who presents for evaluation of headaches.? No history of heart attack, stroke, asthma, or diabetes. Nephrolithiasis (10-12 episodes over a 10 year period). No family history of headaches.    Prior headache history:  Sinus headaches intermittently, resolved with starting allergy medication; no photophobia, phonophobia, or nausea. History of head injury in his teens, no change in headaches or neck pain with that.     July 2023 was at [facility], a rick of lumber fell from 8 feet, hit head, LOC. Vision blacked out for a few minutes, which improved soon after. Neck pain, headache started immediately after head injury. Also has been having numbness, tingling down R>L arm, between shoulder blades.     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 MBBB helpful for a few hours, ablation 2024 worsened pain.     Initial presentation to clinic:  Neck pain:   Neck pain is more bothersome, seems to trigger headaches. Baseline sore, aching neck pain like when you need to pop your neck. Stabbing pain from base of skull down neck to back. Pain is constant, daily. Becomes severe 10/10 at some point throughout the day. Triggered with increased activity, carrying heavy objects, wearing back pack. Pins and needles like TENS unit sensation down R>L arms, worse in hands. Decreased strength in hands and arms. Shooting pain down bilateral legs.     Headaches:  Frequency: Daily since July 2023, severe at some point every day  Intensity:  Severe: 10/10. Baseline: 6-7/10.  Duration: Severe pain lasts at least 4 hours without PRNs  Location and character: Occipital tightness at baseline. Becomes  global when severe    Prodrome:   Associated symptoms: Irritable. Photophobia, phonophobia, kinesiophobia with severe headaches. No nausea.  - No rhinorrhea, lacrimation, ptosis, miosis, or conjunctival injection.  - Difficulty thinking with severe pain.  Some intermittent lightheadedness, not frequent. No vertigo during severe headache.   Postdrome:     Vision: No visual aura. No loss of vision. Intermittent blurred vision with severe headache.  Hearing: No pulsatile tinnitus. High-pitched non-pulsatile tinnitus that waxes and wanes.   Sensory/motor: Baseline paresthesias and arm weakness.     Positional: Not clearly positional. Headaches aren't worse at a particular time of day.  Valsalva: Not triggered with cough, sneeze, or valsalva.  Jaw claudication: None  Sleep: Pain typically keeps him from sleeping and wakes him from sleep. Sleeps 4-5 hours a night. Doesn't snore. No observed apneas.     Triggers: increased activity  Alleviating factors: heat  PRNs upon presentation: ibuprofen  (800 mg 1-2/day, daily since 2023), acetaminophen  (15/month)    Water: at least 6-8 glasses of water  Caffeine: 2 cups of coffee    Previously tried abortive medications:   - Acetaminophen , ibuprofen   - Ketorolac  10 mg PO - helpful  - Cyclobenzaprine  - somewhat helpful  - Methocarbamol  750 mg - somewhat helpful    Previously tried prophylactic medications:   - Duloxetine - possibly years ago.    Previously tried procedures: cervical ablation - worsened neck pain  Other:     Past Medical  History:   Diagnosis Date    GERD (gastroesophageal reflux disease)      Allergies: Patient has no known allergies.     Medications:  Current Outpatient Medications   Medication Sig Dispense Refill    ADDERALL 10 mg Oral Tablet Take 1 Tablet (10 mg total) by mouth Daily      amitriptyline  (ELAVIL ) 10 mg Oral Tablet 10 mg qHS x 1 week, then 20 mg qHS x 1 week, then 30 mg qHS and continue 90 Tablet 3    azelastine  (ASTELIN ) 137 mcg (0.1 %) Nasal Aerosol,  Spray Administer 2 Sprays into each nostril Once a day Use in each nostril as directed 90 mL 3    cetirizine  (ZYRTEC ) 10 mg Oral Tablet Take 1 Tablet (10 mg total) by mouth Once a day 90 Tablet 3    cyclobenzaprine  (FLEXERIL ) 5 mg Oral Tablet 1/2-2 tabs TID PRN headache 60 Tablet 5    lidocaine  (XYLOCAINE ) 5 % Ointment Apply topically Three times a day as needed 50 g 1    montelukast  (SINGULAIR ) 10 mg Oral Tablet Take 1 Tablet (10 mg total) by mouth Once a day 90 Tablet 3    ondansetron  (ZOFRAN  ODT) 4 mg Oral Tablet, Rapid Dissolve Take 1 Tablet (4 mg total) by mouth Every 8 hours as needed for Nausea/Vomiting 10 Tablet 0    pantoprazole (PROTONIX) 40 mg Oral Tablet, Delayed Release (E.C.) Take 1 Tablet (40 mg total) by mouth Twice daily       No current facility-administered medications for this visit.     His family history includes No Known Problems in his father and mother.  He  reports that he has quit smoking. His smoking use included cigarettes. He has never used smokeless tobacco. He reports current alcohol use. He reports that he does not use drugs.     Review of Systems  Constitutional- No fever  Eyes- No visual change  ENT- Hearing normal  CV- No chest pain  Resp- No Shortness of breath  GI- No diarrhea  GU- Bladder normal  MS- No Arthritis  Skin- No rash  Psych- Endorses anxiety, could be better  Endo- No DM   Heme- No easy bruising  PHYSICAL EXAM:    BP (!) 140/92   Pulse 74   Ht 1.803 m (5' 11)   Wt 68.2 kg (150 lb 5.7 oz)   SpO2 98%   BMI 20.97 kg/m     Appearance: No Acute Distress  Ophthalmoscopic: Disc Flat, Normal fundus. No evidence of papilledema.   Carotid/Heart/Peripheral Vascular: RRR  Orientation: Awake, Alert, and Oriented x 3  Mental status:  Memory: Registation and recall normal  Attention: Normal  Knowledge: Appropriate  Language: No aphasia  Speech: No dysarthria  Cranial Nerves:  2 Pupils round, equal, reactive to light  3,4,6 Extraocular Movements Intact; no nystagmus  5 Facial  Sensation Intact  7 No facial asymmetry  8 Intact hearing  9,10 Palate symmetric  11 Good shoulder shrug  12 Tongue Midline  Gait: Stable, No ataxia  Coordination: No ataxia with finger to nose testing  Sensory: Decreased pinprick in left arm. Decreased pinprick around T2  Muscle Tone: Normal  Muscle exam: 5/5 throughout  Reflexes: Normal and symmetric throughout.    No tenderness over bilateral greater occipital nerves, supraorbital nerves, supratrochlear nerves, zygomaticotemporal nerves, or auriculotemporal nerves.     I have reviewed his records as outlined below.  Neurosurgery note 10/23/23: Injury July 2023, lumber fell on him from  8 feet. Neck tightness into shoulders. Hot poker sensation along spine, sharp pain in shoulder blades. Continuous frontal headaches. Numbness, tingling, burning down both arms into hands. Weak grip strength. Pain management, ablation 05/01/23 medial C3-C5 worsened pain, MBB C3-5 Sep, Nov 2024 helped for a couple hours.   - EMG, MRI t-spine ordered.    MRI c-spine wo contrast 09/01/23: No significant stenosis except moderate right foraminal stenosis at C3-C4. Right sided hypertrophy C3-C4.    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 Cervical radiculopathy  M54.12       3. Chronic pain syndrome  G89.4       4. Cervicogenic headache  G44.86 Refer to Physical Therapy - External      5. Chronic migraine without aura without status migrainosus, not intractable  G43.709       6. Paresthesia  R20.2 NCS/EMG         Persistent headache after moderate/severe head injury with features of migraine. Cervicalgia, myofacial pain dysfunction syndrome.     - MRI t-spine pending. Will see if I can  appeal denial based on sensory level.   - EMG pending. Will add on lower extremities as well as uppers for concern for radiculopathy.   - Discussed trigger point injections.   - Start PT for headache, neck pain.   - Start amitriptyline  goal dose 30 mg for headache prophylaxis.   - Start cyclobenzaprine  5-10 mg TID PRN moderate/severe headaches, neck pain.     Encouraged the following:  - Encouraged oral hydration. Avoid caffeine   - Routine exercise   - Routine sleep schedule and 8 hours or more of sleep per night   - Identify and avoid personal triggers or common triggers such as MSG   - Avoid taking any abortive medication more than 10 days per month to avoid medication overuse headache.  - Keep headache calendar.    Return to clinic in 3 months or sooner if needed.    On the day of the encounter, a total of  80 minutes was spent on this patient encounter including review of historical information, examination, documentation and post-visit activities.     Evalene Jubilee, MD 11/22/2023, 12:35  [facility]  Assistant professor

## 2023-11-22 NOTE — Patient Instructions (Signed)
 Start Elavil  (amitriptyline ) nightly for headache prevention.   Start Flexeril  (cyclobenzaprine ) up to 3 times a day as needed for headache, neck pain.     Keep track of headaches on a headache calendar.  At the end of the day, mark your headaches like this:  1: mild headache, functional more than 50% of the time.  2: moderate headache, functional less than 50% of the time.  3: Severe headache, completely incapacitated  And mark when you used and as-needed medicine.                What Can You Do For Your Headaches    The management of headache disorders consists of many factors.  Medications are only part of the solution, and alone will be limited in effectiveness.  There are many other factors that will be your responsibility to manage.  It will likely only be through our combined efforts that you will achieve satisfactory improvement in your head pain and quality of life.  Below are some suggestions of how you can help yourself and help the [facility] Center help you.    Unless told otherwise, you should discontinue the use of any over-the-counter (OTC) medication that you use for headaches.  OTC's can quickly backfire and lead to increased frequency of headaches, even when used only once or twice per week.  Unless told otherwise, you should discontinue the use of any prescription pain medication such as opiates (narcotics) or fiorinal/fioricet for the same reason as above.  Getting regular, refreshing sleep is very important.  Set a specific bedtime that would allow for 8 to 8  hours of sleep.  Do not watch television, listen to the radio, do work, or argue in bed.  If your bed partner snores, wear earplugs or sleep in another room.  Have the same bedtime on the weekend as you have during the week.  Eat a well-balanced and healthy diet.  Avoid common food triggers like Monosodium Glutamate (MSG) and Nitrites.  These are commonly found in snack foods like potato chips, hot dogs, bacon, and other processed meats.   Decrease your intake of red meat and increase chicken or fish.  Drink 6-8 glasses of water per day.  Get regular exercise.  A brisk walk for 30 minutes 3-4 days per week is enough at the beginning.  Improving your heart health and improving your blood flow will have beneficial effects on your headaches.  Eliminate Caffeine from your diet.  Caffeine is one of the worst chemicals for anyone with headaches.  Even only one cup per day can be enough to increase the frequency or severity of your headaches.  Be aware that even decaffeinated coffee and tea have some caffeine in them.   Find someone with whom you can talk about your headaches and other stresses or concerns.  This could be a religious leader, Pharmacist, hospital, psychologist, or even just a good friend.  It is important to be able to talk openly and honestly about what you are going through.  Begin a stress reduction program with meditation, prayer, or biofeedback.  Many resources for biofeedback and relaxation are available on the internet.  Being involved in your regular activities despite the headaches is necessary.  Make every effort to go to work, school, or social activities even if you have a headache.  You cannot let your headaches control you - you must control them and not allow them to dictate to you how you will live.  Avoid "Natural"  cures for your headache unless you discuss them with the [facility] Center first.  Many products claiming to cure or treat headaches can actually be detrimental to your headaches.

## 2023-11-23 ENCOUNTER — Encounter (INDEPENDENT_AMBULATORY_CARE_PROVIDER_SITE_OTHER): Payer: Self-pay | Admitting: Neurology

## 2023-11-27 ENCOUNTER — Ambulatory Visit (HOSPITAL_COMMUNITY): Payer: MEDICAID

## 2023-12-14 NOTE — Progress Notes (Incomplete)
 [facility] Department of Neurosurgery      Operated by [facility]  Return Patient Visit    Date:  12/14/2023  Name: [name]  MRN: [id number]  Age:  43 y.o.  Referring Physician: Luke Lonni POUR, MD  [address]  [address]  [place],  [place] [address]    PCP:  Elouise Stabs, FNP-BC    CC:  No chief complaint on file.    HPI: [name] is a 43 y.o. male with PMH of  neck pain after a rick of lumbar dropped on him from 8 feet at [facility] in July of 2023 who presents to the clinic for follow-up. He is s/p  MRI T Spine today? EMG/NCS scheduled on 12/28/23. Today, he reports     ***                   Prior History:  regarding neck pain. [name] is a 43 y.o male with a PMH of neck pain after a rick of lumbar dropped on him from 8 feet at [facility] in July of 2023. He states that when it dropped on him he lost consciousness. He states that his neck pain is tightness that extends along his neck and into his shoulders. He describes a hot poker sensation along his spine and sharp pain in his shoulder blades. He has continuous headaches that occur in the front of his head. He also states that he has numbness down both arms into his hands and his hands get cold all the time - worse in the winter time. He also describes a tingling/burning pain in his hands and arms.  He states that his grips strength is weaker and cannot lift as much due to the arm pain and neck pain. He takes Ibuprofen  800mg  helps but jsut dulls the pain. Uses heat that makes it feel better. He has not tried physical therapy. He saw Dr. Luke with pain management and had an ablation on 05/01/23 with Dr. Luke (that made his pain worse) on medial C3-C5 and medial branch block at C3-5 on 02/10/23 and 03/28/23 that only helped for a couple of hours. Patient states that he was seen by Dr. Eddie, a neurosurgeon at [facility] and was told that there was not any surgical intervention recommended on his neck at that time.  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 He states that his neck pain has made him miserable. Patient has not tried physical therapy or muscle relaxants. He has seen a Land.    Review of Systems  Other than ROS in the HPI, all other systems were negative.    PHYSICAL EXAM:  Constitutional:  There were no vitals taken for this visit.    General:   Well developed, NAD  Psychiatric:  Attention, mood, and concentration all appropriate.   Integumentary:   Incision ***  Musculoskeletal:  Muscle strength (upper extremities): ***  Muscle strength (lower extremities): ***  Tenderness ***  ROM ***  Neurologic:   Mental Status: A&Ox3, follows commands.   Knowledge: Appropriate  Language: No aphasia  Speech: No dysarthria  Cranial Nerves:  2 No Visual Defect on Confrontation; Pupils round, equal, reactive to light  3,4,6 Extraocular Movements Intact; no nystagmus  5 Facial Sensation Intact  7 No  facial asymmetry  8 Intact hearing  9,10 Palate symmetric  11 Good shoulder shrug  12 Tongue Midline  Sensory: Intact to light touch.     Data reviewed  Images and labs have been reviewed with cosigning physician and compared to previous as available.     *** reviewed by Dr. PIERRETTE on ***  IMPRESSION:               Assessment:    No diagnosis found.    Treatment Plan  [name] is a 43 y.o. male who presents for ***    ***

## 2023-12-25 ENCOUNTER — Ambulatory Visit (INDEPENDENT_AMBULATORY_CARE_PROVIDER_SITE_OTHER): Payer: Self-pay | Admitting: Student in an Organized Health Care Education/Training Program

## 2023-12-25 ENCOUNTER — Other Ambulatory Visit: Payer: Self-pay

## 2023-12-25 ENCOUNTER — Ambulatory Visit
Admission: RE | Admit: 2023-12-25 | Discharge: 2023-12-25 | Disposition: A | Discharge status: Home / Self Care | Payer: MEDICAID | Source: Ambulatory Visit

## 2023-12-25 DIAGNOSIS — M5412 Radiculopathy, cervical region: Secondary | ICD-10-CM | POA: Insufficient documentation

## 2023-12-25 DIAGNOSIS — G894 Chronic pain syndrome: Secondary | ICD-10-CM | POA: Insufficient documentation

## 2023-12-25 DIAGNOSIS — G8929 Other chronic pain: Secondary | ICD-10-CM

## 2023-12-28 ENCOUNTER — Other Ambulatory Visit: Payer: Self-pay

## 2023-12-28 ENCOUNTER — Ambulatory Visit
Admission: RE | Admit: 2023-12-28 | Discharge: 2023-12-28 | Disposition: A | Discharge status: Home / Self Care | Payer: MEDICAID | Source: Ambulatory Visit

## 2023-12-28 DIAGNOSIS — M5412 Radiculopathy, cervical region: Secondary | ICD-10-CM | POA: Insufficient documentation

## 2023-12-28 DIAGNOSIS — G5611 Other lesions of median nerve, right upper limb: Secondary | ICD-10-CM

## 2023-12-28 DIAGNOSIS — R202 Paresthesia of skin: Secondary | ICD-10-CM | POA: Insufficient documentation

## 2023-12-28 DIAGNOSIS — G894 Chronic pain syndrome: Secondary | ICD-10-CM | POA: Insufficient documentation

## 2024-01-02 ENCOUNTER — Ambulatory Visit (INDEPENDENT_AMBULATORY_CARE_PROVIDER_SITE_OTHER): Payer: Self-pay | Admitting: Neurology

## 2024-02-14 NOTE — Progress Notes (Signed)
 [facility] Department of Neurosurgery      Operated by [facility]  Return Patient Visit    Date:  02/19/2024  Name: [name]  MRN: [id number]  Age:  43 y.o.  Referring Physician: Luke Lonni POUR, MD  [address]  [address]  [place],  [place] [address]    PCP:  Elouise Stabs, APRN, CNP    CC:    Chief Complaint   Patient presents with    Follow Up     HPI: [name] is a 43 y.o. male with PMH of neck pain with bilateral arm numbness after a rick of lumbar dropped on him from 8 feet at [facility] in July of 2023. He presents to the clinic for follow-up s/p EMG/NCS of BUE on 12/28/23 and MRI T Spine on 12/25/23. Follows with Dr. Wilfred at Neurology. Today, he reports continues pain that seems to occur at the back of his neck to back of the head to the frontal area/forehead. States his fingers have numbness of which, attempting to ride his motorcycle around 20 minutes causes right arm numbness. He saw Neurology wherein additional EMG/NCS of BLE was ordered. Doing well otherwise.     Prior History:  [name] is a 43 y.o male with a PMH of neck pain. He states that when it dropped on him he lost consciousness. He states that his neck pain is tightness that extends along his neck and into his shoulders. He describes a hot poker sensation along his spine and sharp pain in his shoulder blades. He has continuous headaches that occur in the front of his head. He also states that he has numbness down both arms into his hands and his hands get cold all the time - worse in the winter time. He also describes a tingling/burning pain in his hands and arms.  He states that his grips strength is weaker and cannot lift as much due to the arm pain and neck pain. He takes Ibuprofen  800mg  helps but just dulls the pain. Uses heat that makes it feel better. He has not tried physical therapy. He saw Dr. Luke with pain management and had an ablation on 05/01/23 with Dr. Luke (that made his pain worse)  on medial C3-C5 and medial branch block at C3-5 on 02/10/23 and 03/28/23 that only helped for a couple of hours. Patient states that he was seen by Dr. Eddie, a neurosurgeon at [facility] and was told that there was not any surgical intervention recommended on his neck at that time. 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 He states that his neck pain has made him miserable. Patient has not tried physical therapy or muscle relaxants. He has seen a Land.    Review of Systems  Other than ROS in the HPI, all other systems were negative.    PHYSICAL EXAM:  Constitutional:  BP (!) 140/83   Pulse 98   Temp 36.8 C (98.3 F) (Thermal Scan)   Ht 1.778 m (5' 10)   Wt 68.2 kg (150 lb 6.4 oz)   SpO2 99%   BMI 21.58 kg/m     General:   Well developed, NAD  Psychiatric:  Attention, mood, and concentration all appropriate.   Neurologic:   Mental Status: A&Ox3, follows commands.  Knowledge: Appropriate  Language: No aphasia  Speech: No dysarthria  Cranial Nerves:  2-12 appear intact.     Data reviewed  Images and labs have been reviewed with cosigning physician and compared to previous as available.     MRI T Spine reviewed by Dr. Wellington on 12/25/23  IMPRESSION: Mild thoracic degenerative changes. No evidence significant central canal stenosis.    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 Otherwise this is a normal study. There is no electrophysiological evidence of cervical or lumbosacral radiculopathy or large fiber polyneuropathy.               Assessment:      ICD-10-CM    1. Cervical radiculopathy  M54.12       2. Chronic pain syndrome  G89.4         Treatment Plan  [name] is a 43 y.o. male with PMH of neck pain with  bilateral arm numbness after a rick of lumbar dropped on him from 8 feet at [facility] in July of 2023. He presents for follow-up s/p EMG/NCS of BUE on 12/28/23 and MRI T Spine on 12/25/23. Also saw Dr. Wilfred at Neurology wherein an additional EMG/NCS of BLE was ordered and completed in conjunction to BUE EMG (see impression above). Pain today is located at the back of his neck to back of the head to the frontal area/forehead. Has associated numbness in fingers and right arm becomes numb after 20 minutes of riding his motorcycle. Doing well otherwise.     Dr. Signe discussed there are no surgical treatments to be offered Neurosurgically at this time. Patient verbalized understanding.   Continue following Neurology and Pain Management.    I am scribing for, and in the presence of, Dr. Signe for services provided on 02/19/2024.  Bobbiejo Ishikawa Ma Kieth Leader, SCRIBE     *tgscribe*

## 2024-02-19 ENCOUNTER — Ambulatory Visit (INDEPENDENT_AMBULATORY_CARE_PROVIDER_SITE_OTHER): Payer: MEDICAID

## 2024-02-19 ENCOUNTER — Other Ambulatory Visit: Payer: Self-pay

## 2024-02-19 ENCOUNTER — Ambulatory Visit
Payer: Self-pay | Attending: Student in an Organized Health Care Education/Training Program | Admitting: Student in an Organized Health Care Education/Training Program

## 2024-02-19 ENCOUNTER — Encounter (INDEPENDENT_AMBULATORY_CARE_PROVIDER_SITE_OTHER): Payer: Self-pay

## 2024-02-19 VITALS — BP 146/90 | HR 82 | Ht 70.0 in | Wt 150.4 lb

## 2024-02-19 DIAGNOSIS — G4486 Cervicogenic headache: Secondary | ICD-10-CM | POA: Insufficient documentation

## 2024-02-19 DIAGNOSIS — M5412 Radiculopathy, cervical region: Secondary | ICD-10-CM | POA: Insufficient documentation

## 2024-02-19 DIAGNOSIS — M7918 Myalgia, other site: Secondary | ICD-10-CM

## 2024-02-19 DIAGNOSIS — G894 Chronic pain syndrome: Secondary | ICD-10-CM | POA: Insufficient documentation

## 2024-02-19 DIAGNOSIS — G43709 Chronic migraine without aura, not intractable, without status migrainosus: Secondary | ICD-10-CM | POA: Insufficient documentation

## 2024-02-19 MED ORDER — AMITRIPTYLINE 25 MG TABLET
ORAL_TABLET | ORAL | 5 refills | Status: AC
Start: 2024-02-19 — End: ?

## 2024-02-19 NOTE — Progress Notes (Signed)
 Date:  02/19/2024  Name: [name]  MRN: [id number]  Age:  43 y.o.  Referring Physician: Lauraine Lovett, PA-C    ?History of Present Illness: ?  I had the pleasure of seeing [name] in follow-up at the [facility] on 02/19/2024. He has a history of Nephrolithiasis (10-12 episodes over a 10 year period) .     Today he reports that his headaches have not changed since their last visit 11/22/2023.  Reports amitriptyline  causes fatigue but this was tolerable. Reports he took this consistently for several weeks ( not 90 days) now takes this as needed, noting minimal difference in headache/neck pain.   Cyclobenzaprine  is somewhat helpful for neck pain and sharp shooting pain.   Reports he did complete PT with minimal improvement.   Had appointment with neurosurgery this morning and reports surgery is not indicated at this time.   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     Preventative: amitriptyline  30 mg qHS    Abortive: cyclobenzaprine  5-10 mg TID      Severe Headaches: 4 per week  Total Headaches: 30 per month    Neck pain:   Neck pain is more bothersome, seems to trigger headaches. Baseline sore, aching neck pain like when you need to pop your neck. Stabbing pain from base of skull down neck to back. Pain is constant, daily. Becomes severe 10/10 at some point throughout the day. Triggered with increased activity, carrying heavy objects, wearing back pack. Pins and needles like TENS unit sensation down R>L arms, worse in hands. Decreased strength in hands and arms. Shooting pain down bilateral legs.     Headaches:  Frequency: Daily since July 2023, severe at some point every day  Intensity:  Severe: 10/10. Baseline: 6-7/10.  Duration: Severe pain lasts at least 4 hours without PRNs  Location and character: Occipital tightness at baseline. Becomes global when severe        Previously tried abortive medications:   - Acetaminophen , ibuprofen   - Ketorolac  10 mg PO - helpful  - Cyclobenzaprine  -  somewhat helpful  - Methocarbamol  750 mg - somewhat helpful     Previously tried prophylactic medications:   - Duloxetine - possibly years ago.     Previously tried procedures: cervical ablation - worsened neck pain  Other:         Past history (from 11/22/2023 with Dr Wilfred):  History of Present Illness: ?  [name] is a 42 y.o. yo, right handed male who presents for evaluation of headaches.? No history of heart attack, stroke, asthma, or diabetes. Nephrolithiasis (10-12 episodes over a 10 year period). No family history of headaches.     Prior headache history:  Sinus headaches intermittently, resolved with starting allergy medication; no photophobia, phonophobia, or nausea. History of head injury in his teens, no change in headaches or neck pain with that.      July 2023 was at [facility], a rick of lumber fell from 8 feet, hit head, LOC. Vision blacked out for a few minutes, which improved soon after. Neck pain, headache started immediately after head injury. Also has been having numbness, tingling down R>L arm, between shoulder blades.      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 MBBB helpful for a few hours, ablation 2024 worsened pain.      Initial presentation to clinic:  Neck pain:   Neck pain is more bothersome, seems to trigger headaches. Baseline sore, aching neck pain like  when you need to pop your neck. Stabbing pain from base of skull down neck to back. Pain is constant, daily. Becomes severe 10/10 at some point throughout the day. Triggered with increased activity, carrying heavy objects, wearing back pack. Pins and needles like TENS unit sensation down R>L arms, worse in hands. Decreased strength in hands and arms. Shooting pain down bilateral legs.      Headaches:  Frequency: Daily since July 2023, severe at some point every day  Intensity:  Severe: 10/10. Baseline: 6-7/10.  Duration: Severe pain lasts at least 4 hours without PRNs  Location and character: Occipital  tightness at baseline. Becomes global when severe     Prodrome:   Associated symptoms: Irritable. Photophobia, phonophobia, kinesiophobia with severe headaches. No nausea.  - No rhinorrhea, lacrimation, ptosis, miosis, or conjunctival injection.  - Difficulty thinking with severe pain.  Some intermittent lightheadedness, not frequent. No vertigo during severe headache.   Postdrome:      Vision: No visual aura. No loss of vision. Intermittent blurred vision with severe headache.  Hearing: No pulsatile tinnitus. High-pitched non-pulsatile tinnitus that waxes and wanes.   Sensory/motor: Baseline paresthesias and arm weakness.      Positional: Not clearly positional. Headaches aren't worse at a particular time of day.  Valsalva: Not triggered with cough, sneeze, or valsalva.  Jaw claudication: None  Sleep: Pain typically keeps him from sleeping and wakes him from sleep. Sleeps 4-5 hours a night. Doesn't snore. No observed apneas.      Triggers: increased activity  Alleviating factors: heat  PRNs upon presentation: ibuprofen  (800 mg 1-2/day, daily since 2023), acetaminophen  (15/month)     Water: at least 6-8 glasses of water  Caffeine: 2 cups of coffee        Results of previous testing:     MRI c-spine wo contrast 09/01/23: No significant stenosis except moderate right foraminal stenosis at C3-C4. Right sided hypertrophy C3-C4.     MRI spine thoracic from 12/25/2023  FINDINGS:   Vertebrae:  Thoracic vertebral body heights are preserved.   Bone marrow signal is unremarkable.  Alignment:  Normal.  No spondylolisthesis.  Spinal Cord:  Thoracic spinal cord is of normal size and signal intensities.  Disc spaces:  Small disc bulges at multiple levels including T6-T7, T7-T8, and T10-T11. There is no significant neural compromise.  Paraspinal Tissues:  Unremarkable.     IMPRESSION:  MILD THORACIC DEGENERATIVE CHANGES. NO EVIDENCE SIGNIFICANT CENTRAL CANAL STENOSIS.       Current Outpatient Medications   Medication Sig Dispense  Refill    ADDERALL 10 mg Oral Tablet Take 1 Tablet (10 mg total) by mouth Daily      amitriptyline  (ELAVIL ) 25 mg Oral Tablet Take one tablet by mouth every night x7 days, then take 1 1/2 tabs by mouth every night x7 days, then take two tablets nightly there after. Goal dose 50 mg nightly. 60 Tablet 5    azelastine  (ASTELIN ) 137 mcg (0.1 %) Nasal Aerosol, Spray Administer 2 Sprays into each nostril Once a day Use in each nostril as directed 90 mL 3    cetirizine  (ZYRTEC ) 10 mg Oral Tablet Take 1 Tablet (10 mg total) by mouth Once a day 90 Tablet 3    clonazePAM (KLONOPIN) 0.5 mg Oral Tablet Take 1 Tablet (0.5 mg total) by mouth Three times a day as needed      cyclobenzaprine  (FLEXERIL ) 5 mg Oral Tablet 1/2-2 tabs TID PRN headache 60 Tablet 5    lidocaine  (  XYLOCAINE ) 5 % Ointment Apply topically Three times a day as needed 50 g 1    montelukast  (SINGULAIR ) 10 mg Oral Tablet Take 1 Tablet (10 mg total) by mouth Once a day 90 Tablet 3    ondansetron  (ZOFRAN  ODT) 4 mg Oral Tablet, Rapid Dissolve Take 1 Tablet (4 mg total) by mouth Every 8 hours as needed for Nausea/Vomiting 10 Tablet 0    pantoprazole (PROTONIX) 40 mg Oral Tablet, Delayed Release (E.C.) Take 1 Tablet (40 mg total) by mouth Twice daily       No current facility-administered medications for this visit.        Past medical history, family history, and social history have been reviewed and confirmed.    Sleep: interrupted, wakes often   Mood: Euthymic, some irritability.     Physical exam:  Vitals:    02/19/24 1141   BP: (!) 146/90   Pulse: 82   SpO2: 98%   Weight: 68.2 kg (150 lb 5.7 oz)   Height: 1.778 m (5' 10)   BMI: 21.57     Appearance: No Acute Distress  Mental status: oriented with good memory, attention, knowledge, and language  Gait: Stable, no ataxia      ICD-10-CM    1. Chronic migraine without aura without status migrainosus, not intractable  G43.709 amitriptyline  (ELAVIL ) 25 mg Oral Tablet     Refer to [facility] [place]      2.  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 Cervical radiculopathy  M54.12 Refer to [facility]          Continues to have daily headache and neck pain post moderate/severe head injury. Most bothersome symptoms appears to be neck pain with coinciding bilateral arm radiculopathy. Tried amitriptyline  but inconsistently.  No new symptoms verbalized.     -- Discussed trying amitriptyline  consistently. Patient agreeable. Will increase to 50 mg qHS, titration scheduled discussed. Could consider duloxetine in future if patient does not tolerate amitriptyline .   -- Will place referral to pain clinic, TPI was helpful previously, discussed TPIs today.   -- continue cyclobenzaprine  5-10 mg TID PRN.     Discussed the following:  - Oral hydration - avoid caffeine   - Cardiovascular exercise   - Routine sleep schedule and 8 hours or more of sleep per night   - Identify and avoid personal triggers or common triggers such as MSG   - Keep headache calendar    The patient will follow up with me in 3 months or sooner if needed.    On the day of the encounter, a total of  30 minutes was spent on this patient encounter including review of historical information, examination, documentation and post-visit activities.       Patient was seen independently.     Morna Piedmont, APRN, FNP-C, 02/19/2024, 13:43  [facility]

## 2024-02-22 ENCOUNTER — Ambulatory Visit (INDEPENDENT_AMBULATORY_CARE_PROVIDER_SITE_OTHER): Payer: Self-pay

## 2024-03-04 ENCOUNTER — Other Ambulatory Visit: Payer: Self-pay

## 2024-03-04 ENCOUNTER — Encounter (INDEPENDENT_AMBULATORY_CARE_PROVIDER_SITE_OTHER): Payer: Self-pay

## 2024-03-04 ENCOUNTER — Ambulatory Visit: Payer: MEDICAID

## 2024-03-04 VITALS — BP 153/108 | HR 77 | Temp 97.2°F | Resp 20 | Ht 70.0 in | Wt 151.6 lb

## 2024-03-04 DIAGNOSIS — G44229 Chronic tension-type headache, not intractable: Secondary | ICD-10-CM

## 2024-03-04 DIAGNOSIS — M4722 Other spondylosis with radiculopathy, cervical region: Secondary | ICD-10-CM

## 2024-03-04 DIAGNOSIS — G8929 Other chronic pain: Secondary | ICD-10-CM | POA: Insufficient documentation

## 2024-03-04 DIAGNOSIS — M5412 Radiculopathy, cervical region: Secondary | ICD-10-CM | POA: Insufficient documentation

## 2024-03-04 DIAGNOSIS — M542 Cervicalgia: Secondary | ICD-10-CM | POA: Insufficient documentation

## 2024-03-04 DIAGNOSIS — M7918 Myalgia, other site: Secondary | ICD-10-CM

## 2024-03-04 DIAGNOSIS — M5481 Occipital neuralgia: Secondary | ICD-10-CM | POA: Insufficient documentation

## 2024-03-04 DIAGNOSIS — G4486 Cervicogenic headache: Secondary | ICD-10-CM | POA: Insufficient documentation

## 2024-03-04 DIAGNOSIS — G43709 Chronic migraine without aura, not intractable, without status migrainosus: Secondary | ICD-10-CM | POA: Insufficient documentation

## 2024-03-04 NOTE — Nursing Note (Signed)
 New Patient Visit    [name]    Chief Complaint   Patient presents with    Neck Pain         Garza Pain Rating Scale     On a scale of 0-10, during the past 24 hours, pain has interfered with you usual activity: 8     On a scale of 0-10, during the past 24 hours, pain has interfered with your sleep: 9    On a scale of 0-10, during the past 24 hours, pain has affected your mood: 8     On a scale of 0-10, during the past 24 hours, pain has contributed to your stress: 8     On a scale of 0-10, what is your overall pain Rating: 8        Vitals:    03/04/24 1108   BP: (!) 153/108   Pulse: 77   Resp: 20   Temp: 36.2 C (97.2 F)   SpO2: 100%   Weight: 68.8 kg (151 lb 9.6 oz)   Height: 1.778 m (5' 10)   PainSc:   8   PainLoc: Neck       Body mass index is 21.75 kg/m.    If taking opioid medication, current prescribing provider:     Recent imaging:  Recent Results (from the past 824799999 hours)   MRI SPINE CERVICAL WO CONTRAST    Collection Time: 09/01/23  5:40 PM    Narrative    Jeffrey A Yan    MRI SPINE CERVICAL WO CONTRAST performed on 09/01/2023 5:40 PM.    INDICATION:  M54.12: Cervical radiculitis  G89.4: Chronic pain syndrome  M47.812: Cervical spondylosis   Additional History:  Neck pain radiating to RUE with right hand numbness s/p injury x2 years ago    TECHNIQUE:  Multiplanar, multisequence MRI cervical spine performed without contrast.     COMPARISON: CT imaging May 27, 2022.    FINDINGS:  The prevertebral soft tissues are within normal limits.  The paraspinal soft tissues are unremarkable.    No acute fracture or subluxation. Vertebral body heights are preserved, and facet joints are aligned.  No focal pathologic marrow lesion. Multilevel degenerative disc desiccation.    The posterior fossa is unremarkable.  Normal cord signal without evidence of syrinx.  No definite spinal canal mass.    Degenerative Changes:  C2/3: No significant stenosis.  C3/4: Uncovertebral spurring. Right-sided  hypertrophy. Moderate right foraminal stenosis.  C4/5: Mild uncovertebral spurring. Mild bilateral foraminal narrowing.  C5/6: No significant stenosis.  C6/7: No significant stenosis.  C7/T1: No significant stenosis.      Impression    No acute fracture. Normal cord signal.  Very mild degenerative changes as above.  .  .  .  s/d/g        Radiologist location ID: [id number]        No results found for this or any previous visit (from the past 824799999 hours).   No results found for this or any previous visit (from the past 824799999 hours).   Recent Results (from the past 824799999 hours)   MRI SPINE THORACIC WO CONTRAST    Collection Time: 12/25/23  3:26 PM    Narrative    Jeffrey A Ferraris    RADIOLOGIST: Lynwood Shoemaker    MRI SPINE THORACIC WO CONTRAST performed on 12/25/2023 3:26 PM    CLINICAL HISTORY: M54.12: Cervical radiculopathy  G89.4: Chronic pain syndrome.  chronic back pain crushing type injury 2  years ago    TECHNIQUE:  Noncontrast thoracic spine MRI.    COMPARISON:  None    FINDINGS:   Vertebrae:  Thoracic vertebral body heights are preserved.   Bone marrow signal is unremarkable.    Alignment:  Normal.  No spondylolisthesis.    Spinal Cord:  Thoracic spinal cord is of normal size and signal intensities.    Disc spaces:  Small disc bulges at multiple levels including T6-T7, T7-T8, and T10-T11. There is no significant neural compromise.    Paraspinal Tissues:  Unremarkable.        Impression    MILD THORACIC DEGENERATIVE CHANGES. NO EVIDENCE SIGNIFICANT CENTRAL CANAL STENOSIS.            Radiologist location ID: [id number]       Mri spine thoracic 12/25/23 mri cervical spine 09/01/23      Prior Pain Management:  PT: yes  Heat: yes  Ice: yes  Chiropractor: yes  Bedrest: yes  Injection: yes  VAS (0-10) Now: 8  VAS (0-10) Best: 7  VAS (0-10) Worst: 8    Appliances Needed:       Other Questions:  Recurring Fevers: no  Numbness/tingling: yes  Trouble sleeping: yes  Poor/increased appetite: yes  Weight  loss/Gain: yes  Muscle Spasm: yes  Cold/burning sensation: yes  Skin discolors/where: no  Bowel/bladder issues: no  Urinary urgency-frequency: no    Levon Croft, Ambulatory Care Assistant  03/04/2024, 11:10

## 2024-03-04 NOTE — H&P (Signed)
 HISTORY AND PHYSICAL    [facility]  [facility]  [address]  [address]  [facility] [place] [address]  [phone]       Patient Name: [name]         MRN:  [id number]   DOB: 1980/09/15   Date of Service:  03/04/2024      CC:   Chief Complaint   Patient presents with    Neck Pain          HPI: [name] is a 43 y.o. male who presents for initial evaluation of the above-stated pain complaint as referred by Morna Piedmont, APRN, CNP.    Charts Reviewed:  02/19/2024 - Office Visit with Keczan, Lindsey, APRN, CNP  I had the pleasure of seeing [name] in follow-up at the [facility] on 02/19/2024. He has a history of Nephrolithiasis (10-12 episodes over a 10 year period) .      Today he reports that his headaches have not changed since their last visit 11/22/2023.  Reports amitriptyline  causes fatigue but this was tolerable. Reports he took this consistently for several weeks ( not 90 days) now takes this as needed, noting minimal difference in headache/neck pain.   Cyclobenzaprine  is somewhat helpful for neck pain and sharp shooting pain.   Reports he did complete PT with minimal improvement.   Had appointment with neurosurgery this morning and reports surgery is not indicated at this time.   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      Preventative: amitriptyline  30 mg qHS    Abortive: cyclobenzaprine  5-10 mg TID       Severe Headaches: 4 per week  Total Headaches: 30 per month     Neck pain:   Neck pain is more bothersome, seems to trigger headaches. Baseline sore, aching neck pain like when you need to pop your neck. Stabbing pain from base of skull down neck to back. Pain is constant, daily. Becomes severe 10/10 at some point throughout the day. Triggered with increased activity, carrying heavy objects, wearing back pack. Pins and needles like TENS unit sensation down R>L arms, worse in hands. Decreased strength in hands and  arms. Shooting pain down bilateral legs.      Headaches:  Frequency: Daily since July 2023, severe at some point every day  Intensity:  Severe: 10/10. Baseline: 6-7/10.  Duration: Severe pain lasts at least 4 hours without PRNs  Location and character: Occipital tightness at baseline. Becomes global when severe           Previously tried abortive medications:   - Acetaminophen , ibuprofen   - Ketorolac  10 mg PO - helpful  - Cyclobenzaprine  - somewhat helpful  - Methocarbamol  750 mg - somewhat helpful     Previously tried prophylactic medications:   - Duloxetine - possibly years ago.     Previously tried procedures: cervical ablation - worsened neck pain  Other:            Past history (from 11/22/2023 with Dr Wilfred):  History of Present Illness: ?  [name] is a 44 y.o. yo, right handed male who presents for evaluation of headaches.? No history of heart attack, stroke, asthma, or diabetes. Nephrolithiasis (10-12 episodes over a 10 year period). No family history of headaches.     Prior headache history:  Sinus headaches intermittently, resolved with starting allergy medication; no photophobia, phonophobia, or nausea. History of head injury in his teens,  no change in headaches or neck pain with that.      July 2023 was at [facility], a rick of lumber fell from 8 feet, hit head, LOC. Vision blacked out for a few minutes, which improved soon after. Neck pain, headache started immediately after head injury. Also has been having numbness, tingling down R>L arm, between shoulder blades.      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 MBBB helpful for a few hours, ablation 2024 worsened pain.      Initial presentation to clinic:  Neck pain:   Neck pain is more bothersome, seems to trigger headaches. Baseline sore, aching neck pain like when you need to pop your neck. Stabbing pain from base of skull down neck to back. Pain is constant, daily. Becomes severe 10/10 at some point throughout the day.  Triggered with increased activity, carrying heavy objects, wearing back pack. Pins and needles like TENS unit sensation down R>L arms, worse in hands. Decreased strength in hands and arms. Shooting pain down bilateral legs.      Headaches:  Frequency: Daily since July 2023, severe at some point every day  Intensity:  Severe: 10/10. Baseline: 6-7/10.  Duration: Severe pain lasts at least 4 hours without PRNs  Location and character: Occipital tightness at baseline. Becomes global when severe     Prodrome:   Associated symptoms: Irritable. Photophobia, phonophobia, kinesiophobia with severe headaches. No nausea.  - No rhinorrhea, lacrimation, ptosis, miosis, or conjunctival injection.  - Difficulty thinking with severe pain.  Some intermittent lightheadedness, not frequent. No vertigo during severe headache.   Postdrome:      Vision: No visual aura. No loss of vision. Intermittent blurred vision with severe headache.  Hearing: No pulsatile tinnitus. High-pitched non-pulsatile tinnitus that waxes and wanes.   Sensory/motor: Baseline paresthesias and arm weakness.      Positional: Not clearly positional. Headaches aren't worse at a particular time of day.  Valsalva: Not triggered with cough, sneeze, or valsalva.  Jaw claudication: None  Sleep: Pain typically keeps him from sleeping and wakes him from sleep. Sleeps 4-5 hours a night. Doesn't snore. No observed apneas.      Triggers: increased activity  Alleviating factors: heat  PRNs upon presentation: ibuprofen  (800 mg 1-2/day, daily since 2023), acetaminophen  (15/month)     Water: at least 6-8 glasses of water  Caffeine: 2 cups of coffee     Results of previous testing:      MRI c-spine wo contrast 09/01/23: No significant stenosis except moderate right foraminal stenosis at C3-C4. Right sided hypertrophy C3-C4.      MRI spine thoracic from 12/25/2023  FINDINGS:   Vertebrae:  Thoracic vertebral body heights are preserved.   Bone marrow signal is unremarkable.  Alignment:   Normal.  No spondylolisthesis.  Spinal Cord:  Thoracic spinal cord is of normal size and signal intensities.  Disc spaces:  Small disc bulges at multiple levels including T6-T7, T7-T8, and T10-T11. There is no significant neural compromise.  Paraspinal Tissues:  Unremarkable.     IMPRESSION:  MILD THORACIC DEGENERATIVE CHANGES. NO EVIDENCE SIGNIFICANT CENTRAL CANAL STENOSIS.       03/04/24, [facility]  [facility]  History of Present Illness  [name] is a 43 year old male who presents for pain management consultation due to chronic neck pain and headaches.    He has experienced chronic neck pain and headaches since July 2023 after a rick of lumber  fell on his head, causing loss of consciousness. The neck pain is sore, aching, and stabbing, radiating from the base of the skull down the neck and back, with a constant intensity of 10 out of 10 throughout the day. The pain is exacerbated by increased activity, carrying heavy objects, and wearing a backpack. He also experiences pins and needles sensations down the right more than the left arm, worse in his hands, and shooting pain down both legs.    He experiences approximately four severe headaches per week, totaling about thirty headaches per month, often triggered by neck pain. Various abortive medications including Tylenol , ibuprofen , Toradol , Flexeril , and Robaxin  have been somewhat helpful. Amitriptyline  was tried but caused fatigue. He has also tried duloxetine several years ago. A cervical ablation was performed but worsened his neck pain.    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 An MRI of the cervical spine in April 2025 was performed; the patient was later informed that there were some issues seen on the MRI. He has undergone physical therapy without significant improvement.    He describes additional symptoms including numbness and tingling in the fingers, predominantly in  digits one through three on the right side. He reports decreased grip strength, which affects his work as a Surveyor, [employer]. He also notes a sensation akin to a TENS unit on his shoulder blades and experiences sudden hot needle-like pain in the upper shoulder area.    He has tried various treatments including cervical RFA C3 through 5 bilaterally in December 2024, which worsened his neck pain. He has also had temporary diagnostic injections that provided initial numbness but no lasting relief. He reports a decrease in muscle mass since the onset of his symptoms, impacting his physical appearance and work capabilities.    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 He resides in [place] area.     Nursing Notes:   Raynaldo Carrier, Ambulatory Care Assistant  03/04/24 1110  Signed  New Patient Visit    [name]    Chief Complaint   Patient presents with    Neck Pain         [place] Pain Rating Scale     On a scale of 0-10, during the past 24 hours, pain has interfered with you usual activity: 8     On a scale of 0-10, during the past 24 hours, pain has interfered with your sleep: 9    On a scale of 0-10, during the past 24 hours, pain has affected your mood: 8     On a scale of 0-10, during the past 24 hours, pain has contributed to your stress: 8     On a scale of 0-10, what is your overall pain Rating: 8        Vitals:    03/04/24 1108   BP: (!) 153/108   Pulse: 77   Resp: 20   Temp: 36.2 C (97.2 F)   SpO2: 100%   Weight: 68.8 kg (151 lb 9.6 oz)   Height: 1.778 m (5' 10)   PainSc:   8   PainLoc: Neck       Body mass index is 21.75 kg/m.    If taking opioid medication, current prescribing provider:     Recent imaging:  Recent Results (from the past 824799999 hours)   MRI SPINE CERVICAL WO CONTRAST    Collection Time: 09/01/23  5:40 PM  Narrative    Kycen A Fitzhenry    MRI SPINE CERVICAL WO CONTRAST performed on 09/01/2023 5:40 PM.    INDICATION:   M54.12: Cervical radiculitis  G89.4: Chronic pain syndrome  M47.812: Cervical spondylosis   Additional History:  Neck pain radiating to RUE with right hand numbness s/p injury x2 years ago    TECHNIQUE:  Multiplanar, multisequence MRI cervical spine performed without contrast.     COMPARISON: CT imaging May 27, 2022.    FINDINGS:  The prevertebral soft tissues are within normal limits.  The paraspinal soft tissues are unremarkable.    No acute fracture or subluxation. Vertebral body heights are preserved, and facet joints are aligned.  No focal pathologic marrow lesion. Multilevel degenerative disc desiccation.    The posterior fossa is unremarkable.  Normal cord signal without evidence of syrinx.  No definite spinal canal mass.    Degenerative Changes:  C2/3: No significant stenosis.  C3/4: Uncovertebral spurring. Right-sided hypertrophy. Moderate right foraminal stenosis.  C4/5: Mild uncovertebral spurring. Mild bilateral foraminal narrowing.  C5/6: No significant stenosis.  C6/7: No significant stenosis.  C7/T1: No significant stenosis.      Impression    No acute fracture. Normal cord signal.  Very mild degenerative changes as above.  .  .  .  s/d/g        Radiologist location ID: [id number]        No results found for this or any previous visit (from the past 824799999 hours).   No results found for this or any previous visit (from the past 824799999 hours).   Recent Results (from the past 824799999 hours)   MRI SPINE THORACIC WO CONTRAST    Collection Time: 12/25/23  3:26 PM    Narrative    Jeffrey A Dunnigan    RADIOLOGIST: Lynwood Shoemaker    MRI SPINE THORACIC WO CONTRAST performed on 12/25/2023 3:26 PM    CLINICAL HISTORY: M54.12: Cervical radiculopathy  G89.4: Chronic pain syndrome.  chronic back pain crushing type injury 2 years ago    TECHNIQUE:  Noncontrast thoracic spine MRI.    COMPARISON:  None    FINDINGS:   Vertebrae:  Thoracic vertebral body heights are preserved.   Bone marrow signal is  unremarkable.    Alignment:  Normal.  No spondylolisthesis.    Spinal Cord:  Thoracic spinal cord is of normal size and signal intensities.    Disc spaces:  Small disc bulges at multiple levels including T6-T7, T7-T8, and T10-T11. There is no significant neural compromise.    Paraspinal Tissues:  Unremarkable.        Impression    MILD THORACIC DEGENERATIVE CHANGES. NO EVIDENCE SIGNIFICANT CENTRAL CANAL STENOSIS.            Radiologist location ID: [id number]       Mri spine thoracic 12/25/23 mri cervical spine 09/01/23      Prior Pain Management:  PT: yes  Heat: yes  Ice: yes  Chiropractor: yes  Bedrest: yes  Injection: yes  VAS (0-10) Now: 8  VAS (0-10) Best: 7  VAS (0-10) Worst: 8    Appliances Needed:       Other Questions:  Recurring Fevers: no  Numbness/tingling: yes  Trouble sleeping: yes  Poor/increased appetite: yes  Weight loss/Gain: yes  Muscle Spasm: yes  Cold/burning sensation: yes  Skin discolors/where: no  Bowel/bladder issues: no  Urinary urgency-frequency: no    Levon Croft, Ambulatory Care Assistant  03/04/2024, 11:10  [place] Pain Rating Scale     On a scale of 0-10, during the past 24 hours, pain has interfered with you usual activity: 8     On a scale of 0-10, during the past 24 hours, pain has interfered with your sleep: 9    On a scale of 0-10, during the past 24 hours, pain has affected your mood: 8     On a scale of 0-10, during the past 24 hours, pain has contributed to your stress: 8     On a scale of 0-10, what is your overall pain Rating: 8        PAST MEDICAL HISTORY:   Current Outpatient Medications   Medication Instructions    ADDERALL 10 mg Oral Tablet Take 1 Tablet (10 mg total) by mouth Daily    amitriptyline  (ELAVIL ) 25 mg Oral Tablet Take one tablet by mouth every night x7 days, then take 1 1/2 tabs by mouth every night x7 days, then take two tablets nightly there after. Goal dose 50 mg nightly.    azelastine  (ASTELIN ) 137 mcg (0.1 %) Nasal Aerosol, Spray 2 Sprays, Each  Nostril, Daily, Use in each nostril as directed    cetirizine  (ZYRTEC ) 10 mg, Oral, Daily    clonazePAM (KLONOPIN) 0.5 mg, 3 TIMES DAILY PRN    cyclobenzaprine  (FLEXERIL ) 5 mg Oral Tablet 1/2-2 tabs TID PRN headache    lidocaine  (XYLOCAINE ) 5 % Ointment Apply Topically, 3 TIMES DAILY PRN    montelukast  (SINGULAIR ) 10 mg, Oral, Daily    ondansetron  (ZOFRAN  ODT) 4 mg, Oral, EVERY 8 HOURS PRN    pantoprazole (PROTONIX) 40 mg, 2 TIMES DAILY         PAST SURGICAL HISTORY:   Past Surgical History:   Procedure Laterality Date    ABDOMINAL SURGERY      HX HERNIA REPAIR      HX NERVE BLOCK Bilateral 03/28/2023    BILATERAL C3, C4, C5 MBNB TO TREAT C3-4, C4-5- 2/2    LAPAROSCOPIC CHOLECYSTECTOMY      LITHOTRIPSY      RADIOFREQUENCY ABLATION Bilateral 05/01/2023    C3,C4,C5 RFA    URETERAL STENT PLACEMENT               FAMILY HEALTH HISTORY:   Family Medical History:       Problem Relation (Age of Onset)    No Known Problems Mother, Father                PSYCHOSOCIAL HX:     Social History[1]      MEDICATIONS:   Outpatient Medications Marked as Taking for the 03/04/24 encounter (Office Visit) with Tempie Locus, DO   Medication Sig    ADDERALL 10 mg Oral Tablet Take 1 Tablet (10 mg total) by mouth Daily    amitriptyline  (ELAVIL ) 25 mg Oral Tablet Take one tablet by mouth every night x7 days, then take 1 1/2 tabs by mouth every night x7 days, then take two tablets nightly there after. Goal dose 50 mg nightly.    azelastine  (ASTELIN ) 137 mcg (0.1 %) Nasal Aerosol, Spray Administer 2 Sprays into each nostril Once a day Use in each nostril as directed    cetirizine  (ZYRTEC ) 10 mg Oral Tablet Take 1 Tablet (10 mg total) by mouth Once a day    clonazePAM (KLONOPIN) 0.5 mg Oral Tablet Take 1 Tablet (0.5 mg total) by mouth Three times a day as needed    cyclobenzaprine  (FLEXERIL ) 5 mg Oral Tablet  1/2-2 tabs TID PRN headache    lidocaine  (XYLOCAINE ) 5 % Ointment Apply topically Three times a day as needed    montelukast  (SINGULAIR ) 10  mg Oral Tablet Take 1 Tablet (10 mg total) by mouth Once a day    ondansetron  (ZOFRAN  ODT) 4 mg Oral Tablet, Rapid Dissolve Take 1 Tablet (4 mg total) by mouth Every 8 hours as needed for Nausea/Vomiting    pantoprazole (PROTONIX) 40 mg Oral Tablet, Delayed Release (E.C.) Take 1 Tablet (40 mg total) by mouth Twice daily        ALLERGIES: Allergies[2]    REVIEW OF SYSTEMS:   12 systems were reviewed and are positive as follows:+ as noted above. All other systems negative.     PHYSICAL EXAMINATION:    Vitals:    03/04/24 1108   BP: (!) 153/108   Pulse: 77   Resp: 20   Temp: 36.2 C (97.2 F)   SpO2: 100%   Weight: 68.8 kg (151 lb 9.6 oz)   Height: 1.778 m (5' 10)   PainSc:   8   PainLoc: Neck     Body mass index is 21.75 kg/m.    General: Patient resting in a seated position in NAD.    HEENT: The patient is normocephalic and atraumatic. PERRLA. EOMI.  Cardiovascular: Bilateral radial pulses are intact and regular.  Pulmonary: No increased work of breathing and no accessory muscle use noted.  Abdomen: Soft, non tender and non distended.  Lymphatic System: No adenopathy identified.                                                                               Skin: Warm, dry.                                      Psych: Affect normal. A&Ox3. Demonstrates no pain behavior, symptom magnification or drug-seeking behavior.      Neurological/Musculoskeletal: Visual Inspection: No appreciable muscle atrophy.  Cranial nerves: CN II-XII grossly intact  Motor:   Arm Right Left Leg Right Left   Elbow flexion (C5) 5/5 5/5 Hip flexion (L2) 5/5 5/5   Wrist extension (C6) 5/5 5/5 Knee extension (L3) 5/5 5/5   Triceps extension (C7) 5/5 5/5 Ankle dorsiflexion (L4) 5/5 5/5   Finger flexion (C8) 5/5 5/5 Toe extension (L5) 5/5 5/5   Finger abduction (T1) 5/5 5/5 Ankle plantar flexion (S1) 5/5 5/5   Reflexes:    Bicep BR Triceps Patella Achilles   Right 2+ 2+ 2+ 2+ 2+   Left 2+ 2+ 2+ 2+ 2+   Sensation: All major dermatomes in the  bilateral upper and bilateral lower extremities intact to light touch with exception of scattered BUE   Hoffman's reflex: Negative bilaterally  Gait: Nonantalgic without use of assistive device  Station: Neutral  +Tinel's at cubital tunnel    Cervical spine special tests:  Visual inspection: Normal lordotic curvature.  Palpation:   - Cervical paraspinal tenderness Positive right and Positive left  - Facet tenderness Positive right and Positive left  - Trigger points present to BL cervical paraspinals, trapezii, levator scapulae,  and rhomboid muscles  Cervical facet loading Positive right and Positive left  Spurling's: Negative right and Negative left      DATA REVIEW:  Recent Results (from the past 824799999 hours)   MRI SPINE CERVICAL WO CONTRAST    Collection Time: 09/01/23  5:40 PM    Narrative    Jeffrey A Yankee    MRI SPINE CERVICAL WO CONTRAST performed on 09/01/2023 5:40 PM.    INDICATION:  M54.12: Cervical radiculitis  G89.4: Chronic pain syndrome  M47.812: Cervical spondylosis   Additional History:  Neck pain radiating to RUE with right hand numbness s/p injury x2 years ago    TECHNIQUE:  Multiplanar, multisequence MRI cervical spine performed without contrast.     COMPARISON: CT imaging May 27, 2022.    FINDINGS:  The prevertebral soft tissues are within normal limits.  The paraspinal soft tissues are unremarkable.    No acute fracture or subluxation. Vertebral body heights are preserved, and facet joints are aligned.  No focal pathologic marrow lesion. Multilevel degenerative disc desiccation.    The posterior fossa is unremarkable.  Normal cord signal without evidence of syrinx.  No definite spinal canal mass.    Degenerative Changes:  C2/3: No significant stenosis.  C3/4: Uncovertebral spurring. Right-sided hypertrophy. Moderate right foraminal stenosis.  C4/5: Mild uncovertebral spurring. Mild bilateral foraminal narrowing.  C5/6: No significant stenosis.  C6/7: No significant stenosis.  C7/T1: No  significant stenosis.      Impression    No acute fracture. Normal cord signal.  Very mild degenerative changes as above.  .  .  .  s/d/g        Radiologist location ID: [id number]       Recent Results (from the past 824799999 hours)   MRI SPINE THORACIC WO CONTRAST    Collection Time: 12/25/23  3:26 PM    Narrative    Jeffrey A Shryock    RADIOLOGIST: Lynwood Shoemaker    MRI SPINE THORACIC WO CONTRAST performed on 12/25/2023 3:26 PM    CLINICAL HISTORY: M54.12: Cervical radiculopathy  G89.4: Chronic pain syndrome.  chronic back pain crushing type injury 2 years ago    TECHNIQUE:  Noncontrast thoracic spine MRI.    COMPARISON:  None    FINDINGS:   Vertebrae:  Thoracic vertebral body heights are preserved.   Bone marrow signal is unremarkable.    Alignment:  Normal.  No spondylolisthesis.    Spinal Cord:  Thoracic spinal cord is of normal size and signal intensities.    Disc spaces:  Small disc bulges at multiple levels including T6-T7, T7-T8, and T10-T11. There is no significant neural compromise.    Paraspinal Tissues:  Unremarkable.        Impression    MILD THORACIC DEGENERATIVE CHANGES. NO EVIDENCE SIGNIFICANT CENTRAL CANAL STENOSIS.            Radiologist location ID: [id number]       No results found for this or any previous visit (from the past 824799999 hours).     I have personally reviewed the aforementioned studies; my interpretation is as follows: Mild cervical spondylosis    No results found for: HA1C  No results found for: WBC, WBCJ, HGB, HCT, PLTCNT, SEDRATE, ESR, RBC, MCV, MCHC, MCH, RDW, MPV  COMPREHENSIVE METABOLIC PANEL  No results found for: SODIUM, POTASSIUM, CHLORIDE, CO2, ANIONGAP, BUN, CREATININE, GLUCOSENF, GLUCOSE, CALCIUM, PHOSPHORUS, ALB, ALBUMIN, TOTALPROTEIN, ALKPHOS, AST, ALT, GFR      ASSESSMENT and PLAN:  Chronic neck pain  with abnormal neurologic examination    Chronic migraine without aura without status migrainosus,  not intractable    Bilateral occipital neuralgia    Cervicogenic headache    Trigger point of neck    Cervical radiculopathy    Assessment & Plan  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 MRI shows mild cervical spondylosis, moderate right neuroforaminal stenosis at C3-4, mild bilateral stenosis at C4-5. Previous cervical RFA worsened pain. Differential includes cervical facet arthropathy, trigger point syndrome, radiculopathy, cubital tunnel syndrome, and carpal tunnel syndrome per EMG.  - Plan for BL GON and LON blocks with steroid and anesthetic.  - Consider RFA if effective but not durable  - Consider epidural injection if more prominent radicular/neuropathic symptoms  - Avoid further cervical RFA due to previous adverse response (worsening pain)    Occipital neuralgia  Occipital neuralgia with pain at skull base radiating to front, resembling ram's horn. Likely due to occipital nerve entrapment or irritation by tight cervical muscles. Previous nerve blocks provided temporary relief.  - Plan for BL GON and LON blocks with steroid and anesthetic.  - Consider RFA if effective but not durable    Chronic headaches (migraine and tension-type)  Chronic headaches, 30 times/month, 4 severe weekly, often triggered by neck pain, described as tension-type with occipital neuralgia features. Previous treatments include Tylenol , ibuprofen , Toradol , Flexeril , Robaxin  with some relief. Amitriptyline  ineffective due to fatigue.  - Evaluate response to occipital nerve blocks for potential headache relief.    Our impression, treatment recommendations and plan from today's visit were reviewed in detail with the patient in the office. All of the patient's questions were answered. The patient is in agreement with our plan. The procedure was explained using a spine model, including  technique, benefits, alternatives and the associated risks including but not limited to pain at the injection site, infection , bleeding, short-term or prolonged worsening of the pain, reactions to medications administered, and failure of the pain to improve.    The patient verbalized understanding of the above plan and the patient wishes to move forward with the above noted plan.    Camellia Bristol, DO   Assistant Professor of Chronic Pain Medicine, PM&R  [facility]  03/04/24 11:09         [1]   Social History  Tobacco Use    Smoking status: Former     Types: Cigarettes    Smokeless tobacco: Never   Vaping Use    Vaping status: Never Used   Substance Use Topics    Alcohol use: Yes     Comment: occasionally    Drug use: Never   [2] No Known Allergies

## 2024-03-07 ENCOUNTER — Telehealth (INDEPENDENT_AMBULATORY_CARE_PROVIDER_SITE_OTHER): Payer: Self-pay

## 2024-03-07 NOTE — Telephone Encounter (Signed)
 CM contacted PT and went over Non Fasting guidelines for upcoming procedure on 11.04.25 with Dr Tempie. PT states they are not currently taking antibiotics. Pt was instructed to inform the [facility] ([facility]) if they start any antibiotics. Between their guideline review date and  up to and including their appt date to notify staff ASAP. Because their appt may be canceled or rescheduled. PT had no further questions at this time Clinic phone number was provided to PT at this time.    03/07/2024  Toribio Molt, CASE MANAGER

## 2024-03-19 ENCOUNTER — Ambulatory Visit: Payer: MEDICAID

## 2024-03-19 ENCOUNTER — Other Ambulatory Visit: Payer: Self-pay

## 2024-03-19 VITALS — Temp 98.4°F | Resp 20 | Wt 152.8 lb

## 2024-03-19 DIAGNOSIS — M5481 Occipital neuralgia: Secondary | ICD-10-CM | POA: Insufficient documentation

## 2024-03-19 NOTE — Nursing Note (Signed)
[place] Pain Rating Scale     On a scale of 0-10, during the past 24 hours, pain has interfered with you usual activity: 9     On a scale of 0-10, during the past 24 hours, pain has interfered with your sleep: 9    On a scale of 0-10, during the past 24 hours, pain has affected your mood: 10     On a scale of 0-10, during the past 24 hours, pain has contributed to your stress: 9     On a scale of 0-10, what is your overall pain Rating: 8

## 2024-03-19 NOTE — Patient Instructions (Signed)
 [facility]  [address]  [place] [address]  Dept: [phone]  Dept Fax: [phone]  [phone]                                                 SPECIAL PROCEDURES                                     DISCHARGE FORM                                          [phone]      Please follow the instructions listed below for your procedures.  If you have questions concerning your procedure, you may call and leave a message.  Messages will be returned by the end of the next business day.  If you have an emergency, proceed to your local Emergency Department.      PROCEDURE: BILATERAL GREATER AND LESSER OCCIPITAL NERVE BLOCKS    Do not drive a car or operate machinery until tomorrow.  Rest today and return to normal activities tomorrow.  If you are on restricted activities by your physician, please continue to follow these.  If you are not sure, contact your physician.  It is possible to experience mild numbness of the lower back and legs.  This is temporary.  If you have soreness at the injection site, the application of heat or ice may be helpful. Mild analgesics may also be used.  In case of severe headache; lie flat to decrease it.  Increase all fluids especially those with caffeine.  Mild analgesics are also appropriate.  If headache is not relieved by these measures, contact the Pain Clinic.  Steroid injections may cause temporary increase of blood sugar levels.    These instructions have been reviewed with the patient and appropriate questions have answers.  Reche Lawrence, RN 03/19/2024 11:36

## 2024-03-19 NOTE — Procedures (Signed)
 [facility]  [address]  [facility] [place] [address]  Operated by [facility]  Procedure Note    Name: ORBIE GRUPE MRN:  [id number]   Date: 03/19/2024 DOB:  09-16-1980 (43 y.o.)         64405 - INJ ANESTHETIC/STEROID; GREATER OCCIPITAL NERVE (AMB ONLY-PD)    Performed by: Tempie Locus, DO  Authorized by: Tempie Locus, DO    Time Out:     Immediately before the procedure, a time out was called:  Yes    Patient verified:  Yes    Procedure Verified:  Yes    Site Verified:  Yes    [facility]  [address]  [place] [address]  [phone]    PATIENT NAME: [name]  CHART NUMBER: [id number]  DATE OF BIRTH: 04/06/1981  DATE OF SERVICE: 03/19/2024    SITE OF SERVICE  [facility]    PREOPERATIVE DIAGNOSIS:  Occipital neuralgia    POSTOPERATIVE DIAGNOSIS:  Same    NAME OF PROCEDURE:  Palpation-guided greater and lesser occipital nerve blocks    SURGEON: Locus Tempie, DO    ANESTHESIA:  None    ESTIMATED BLOOD LOSS:  None    COMPLICATIONS:  None    DESCRIPTION OF PROCEDURE:  After informed consent was obtained, the patient was positioned comfortably in the seated or prone position in the procedure suite. The posterior scalp and upper cervical region were prepped with antiseptic solution and allowed to dry.    Anatomic landmarks were palpated, including the external occipital protuberance and the superior nuchal region. The occipital artery was palpated just lateral to the protuberance, and used as a landmark for nerve localization.    The greater occipital nerve was identified approximately 1?cm medial to the occipital artery, and the lesser occipital nerve approximately 1?cm lateral. Injection sites were marked accordingly.    Using palpation-guided technique, and after negative aspiration at each site, 2.5?cc of injectate--consisting of 9?cc of 0.2% ropivacaine and 1?cc of 10  mg/mL dexamethasone  --was injected using a 27-gauge needle. Hemostasis was achieved with gentle pressure.    The patient tolerated the procedure well without paresthesias, bleeding, or other complications.    Side: Bilateral    The patient was monitored post-procedure and discharged home in stable condition with written aftercare instructions and follow-up guidance provided.    Locus Tempie, DO 03/19/2024, 12:05

## 2024-04-01 ENCOUNTER — Ambulatory Visit: Payer: MEDICAID

## 2024-04-01 ENCOUNTER — Other Ambulatory Visit: Payer: Self-pay

## 2024-04-01 DIAGNOSIS — G4486 Cervicogenic headache: Secondary | ICD-10-CM

## 2024-04-01 DIAGNOSIS — M5481 Occipital neuralgia: Secondary | ICD-10-CM

## 2024-04-01 DIAGNOSIS — G8929 Other chronic pain: Secondary | ICD-10-CM

## 2024-04-01 DIAGNOSIS — M542 Cervicalgia: Secondary | ICD-10-CM

## 2024-04-01 DIAGNOSIS — G43709 Chronic migraine without aura, not intractable, without status migrainosus: Secondary | ICD-10-CM

## 2024-04-01 NOTE — Progress Notes (Signed)
 [facility]  [address], [address]  [place], [place] [address]  [phone]        TELEMEDICINE DOCUMENTATION: Video visit  Patient Location: Home address   Patient/family aware of provider location:  Yes  Patient/family consent for telemedicine:  Yes  Examination observed and performed by:  Heather DELENA Fellows, APRN, CNP    CHIEF COMPLAINT:  Chief Complaint   Patient presents with    Follow Up After Injection      HISTORY OF PRESENT ILLNESS:   [name] is a 43 y.o. male who was contacted today via telemedicine for procedure follow up. Patient had bilateral LON and GON block #1 performed on 03/19/24. Patient endorses 80% relief 1 day(s). Patient denies any side effects or complication with the procedure. Denies red flag signs. Denies recent falls.     Previous HPI from Dr. Tempie on 03/04/24: [name] is a 43 y.o. male who presents for initial evaluation of the above-stated pain complaint as referred by Lindsey Keczan, APRN, CNP.     Charts Reviewed:  02/19/2024 - Office Visit with Keczan, Lindsey, APRN, CNP  I had the pleasure of seeing [name] in follow-up at the [facility] on 02/19/2024. He has a history of Nephrolithiasis (10-12 episodes over a 10 year period) .      Today he reports that his headaches have not changed since their last visit 11/22/2023.  Reports amitriptyline  causes fatigue but this was tolerable. Reports he took this consistently for several weeks ( not 90 days) now takes this as needed, noting minimal difference in headache/neck pain.   Cyclobenzaprine  is somewhat helpful for neck pain and sharp shooting pain.   Reports he did complete PT with minimal improvement.   Had appointment with neurosurgery this morning and reports surgery is not indicated at this time.   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      Preventative: amitriptyline  30 mg qHS    Abortive: cyclobenzaprine  5-10 mg TID       Severe Headaches: 4 per week  Total  Headaches: 30 per month     Neck pain:   Neck pain is more bothersome, seems to trigger headaches. Baseline sore, aching neck pain like when you need to pop your neck. Stabbing pain from base of skull down neck to back. Pain is constant, daily. Becomes severe 10/10 at some point throughout the day. Triggered with increased activity, carrying heavy objects, wearing back pack. Pins and needles like TENS unit sensation down R>L arms, worse in hands. Decreased strength in hands and arms. Shooting pain down bilateral legs.      Headaches:  Frequency: Daily since July 2023, severe at some point every day  Intensity:  Severe: 10/10. Baseline: 6-7/10.  Duration: Severe pain lasts at least 4 hours without PRNs  Location and character: Occipital tightness at baseline. Becomes global when severe     Previously tried abortive medications:   - Acetaminophen , ibuprofen   - Ketorolac  10 mg PO - helpful  - Cyclobenzaprine  - somewhat helpful  - Methocarbamol  750 mg - somewhat helpful     Previously tried prophylactic medications:   - Duloxetine - possibly years ago.     Previously tried procedures: cervical ablation - worsened neck pain  Other:      Past history (from 11/22/2023 with Dr Wilfred):  History of Present Illness: ?  [name] is a 43 y.o. yo, right handed male who presents for evaluation of  headaches.? No history of heart attack, stroke, asthma, or diabetes. Nephrolithiasis (10-12 episodes over a 10 year period). No family history of headaches.     Prior headache history:  Sinus headaches intermittently, resolved with starting allergy medication; no photophobia, phonophobia, or nausea. History of head injury in his teens, no change in headaches or neck pain with that.      July 2023 was at [facility], a rick of lumber fell from 8 feet, hit head, LOC. Vision blacked out for a few minutes, which improved soon after. Neck pain, headache started immediately after head injury. Also has been having numbness, tingling down  R>L arm, between shoulder blades.      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 MBBB helpful for a few hours, ablation 2024 worsened pain.      Initial presentation to clinic:  Neck pain:   Neck pain is more bothersome, seems to trigger headaches. Baseline sore, aching neck pain like when you need to pop your neck. Stabbing pain from base of skull down neck to back. Pain is constant, daily. Becomes severe 10/10 at some point throughout the day. Triggered with increased activity, carrying heavy objects, wearing back pack. Pins and needles like TENS unit sensation down R>L arms, worse in hands. Decreased strength in hands and arms. Shooting pain down bilateral legs.      Headaches:  Frequency: Daily since July 2023, severe at some point every day  Intensity:  Severe: 10/10. Baseline: 6-7/10.  Duration: Severe pain lasts at least 4 hours without PRNs  Location and character: Occipital tightness at baseline. Becomes global when severe     Prodrome:   Associated symptoms: Irritable. Photophobia, phonophobia, kinesiophobia with severe headaches. No nausea.  - No rhinorrhea, lacrimation, ptosis, miosis, or conjunctival injection.  - Difficulty thinking with severe pain.  Some intermittent lightheadedness, not frequent. No vertigo during severe headache.   Postdrome:      Vision: No visual aura. No loss of vision. Intermittent blurred vision with severe headache.  Hearing: No pulsatile tinnitus. High-pitched non-pulsatile tinnitus that waxes and wanes.   Sensory/motor: Baseline paresthesias and arm weakness.      Positional: Not clearly positional. Headaches aren't worse at a particular time of day.  Valsalva: Not triggered with cough, sneeze, or valsalva.  Jaw claudication: None  Sleep: Pain typically keeps him from sleeping and wakes him from sleep. Sleeps 4-5 hours a night. Doesn't snore. No observed apneas.      Triggers: increased activity  Alleviating factors: heat  PRNs upon presentation:  ibuprofen  (800 mg 1-2/day, daily since 2023), acetaminophen  (15/month)     Water: at least 6-8 glasses of water  Caffeine: 2 cups of coffee     Results of previous testing:      MRI c-spine wo contrast 09/01/23: No significant stenosis except moderate right foraminal stenosis at C3-C4. Right sided hypertrophy C3-C4.      MRI spine thoracic from 12/25/2023  FINDINGS:   Vertebrae:  Thoracic vertebral body heights are preserved.   Bone marrow signal is unremarkable.  Alignment:  Normal.  No spondylolisthesis.  Spinal Cord:  Thoracic spinal cord is of normal size and signal intensities.  Disc spaces:  Small disc bulges at multiple levels including T6-T7, T7-T8, and T10-T11. There is no significant neural compromise.  Paraspinal Tissues:  Unremarkable.     IMPRESSION:  MILD THORACIC DEGENERATIVE CHANGES. NO EVIDENCE SIGNIFICANT CENTRAL CANAL STENOSIS.        03/04/24, [facility]  PA[facility] MANA[facility]EMENT,  [facility]  History of Present Illness  [name] is a 43 year old male who presents for pain management consultation due to chronic neck pain and headaches.     He has experienced chronic neck pain and headaches since July 2023 after a rick of lumber fell on his head, causing loss of consciousness. The neck pain is sore, aching, and stabbing, radiating from the base of the skull down the neck and back, with a constant intensity of 10 out of 10 throughout the day. The pain is exacerbated by increased activity, carrying heavy objects, and wearing a backpack. He also experiences pins and needles sensations down the right more than the left arm, worse in his hands, and shooting pain down both legs.     He experiences approximately four severe headaches per week, totaling about thirty headaches per month, often triggered by neck pain. Various abortive medications including Tylenol , ibuprofen , Toradol , Flexeril , and Robaxin  have been somewhat helpful. Amitriptyline  was tried but caused  fatigue. He has also tried duloxetine several years ago. A cervical ablation was performed but worsened his neck pain.     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 An MRI of the cervical spine in April 2025 was performed; the patient was later informed that there were some issues seen on the MRI. He has undergone physical therapy without significant improvement.     He describes additional symptoms including numbness and tingling in the fingers, predominantly in digits one through three on the right side. 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 He also notes a sensation akin to a TENS unit on his shoulder blades and experiences sudden hot needle-like pain in the upper shoulder area.     He has tried various treatments including cervical RFA C3 through 5 bilaterally in December 2024, which worsened his neck pain. He has also had temporary diagnostic injections that provided initial numbness but no lasting relief. He reports a decrease in muscle mass since the onset of his symptoms, impacting his physical appearance and work capabilities.     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 He resides in [place] , [place] area.   TREATMENT HISTORY:      Helpful Not Helpful Not Tried  Comments   MBB       RFA/Cryoablation       Epidural       Transforaminal/  Nerve root block        SI injection       Intraarticular Joint        Sympathetic Block       Other  X   03/19/24- bilateral LON and GON block #1  80% relief 1 day(s)   TPI       GTB        SCS       Pain pump         Physical Therapy or HEP        Massage Therapy       Acupuncture        Chiropractor       Exercise Physiologist        Counseling/CBT         [place] Pain Rating Scale:     On a scale of 0-10, during the past 24 hours, pain has interfered with you usual activity: 8     On a scale  of 0-10, during the past 24 hours, pain has interfered with your sleep:  8     On a scale of 0-10, during the past 24 hours, pain has affected your mood: 8     On a scale of 0-10, during the past 24 hours, pain has contributed to your stress: 8     On a scale of 0-10, what is your overall pain Rating: 5-6     PAST MEDICAL/ FAMILY/ SOCIAL HISTORY:  Past Medical History:   Diagnosis Date    GERD (gastroesophageal reflux disease)      Allergies[1]  Current Outpatient Medications   Medication Sig    ADDERALL 10 mg Oral Tablet Take 1 Tablet (10 mg total) by mouth Daily    amitriptyline  (ELAVIL ) 25 mg Oral Tablet Take one tablet by mouth every night x7 days, then take 1 1/2 tabs by mouth every night x7 days, then take two tablets nightly there after. Goal dose 50 mg nightly.    azelastine  (ASTELIN ) 137 mcg (0.1 %) Nasal Aerosol, Spray Administer 2 Sprays into each nostril Once a day Use in each nostril as directed    cetirizine  (ZYRTEC ) 10 mg Oral Tablet Take 1 Tablet (10 mg total) by mouth Once a day    clonazePAM (KLONOPIN) 0.5 mg Oral Tablet Take 1 Tablet (0.5 mg total) by mouth Three times a day as needed    cyclobenzaprine  (FLEXERIL ) 5 mg Oral Tablet 1/2-2 tabs TID PRN headache    lidocaine  (XYLOCAINE ) 5 % Ointment Apply topically Three times a day as needed    montelukast  (SINGULAIR ) 10 mg Oral Tablet Take 1 Tablet (10 mg total) by mouth Once a day    ondansetron  (ZOFRAN  ODT) 4 mg Oral Tablet, Rapid Dissolve Take 1 Tablet (4 mg total) by mouth Every 8 hours as needed for Nausea/Vomiting    pantoprazole (PROTONIX) 40 mg Oral Tablet, Delayed Release (E.C.) Take 1 Tablet (40 mg total) by mouth Twice daily     Past Surgical History:   Procedure Laterality Date    ABDOMINAL SURGERY      HX HERNIA REPAIR      HX NERVE BLOCK Bilateral 03/28/2023    BILATERAL C3, C4, C5 MBNB TO TREAT C3-4, C4-5- 2/2    LAPAROSCOPIC CHOLECYSTECTOMY      LITHOTRIPSY      RADIOFREQUENCY ABLATION Bilateral 05/01/2023    C3,C4,C5 RFA    URETERAL STENT PLACEMENT       Social History[2]  Family Medical History:        Problem Relation (Age of Onset)    No Known Problems Mother, Father          REVIEW OF SYSTEMS:   CONSTITUTIONAL: Denies weight change. Denies fever.   GI: Denies bowel incontinence.  GU: Denies urinary incontinence or retention.  NEUROLOGICAL: Denies paralysis. Denies tremors    PSYCHIATRIC: Denies acute changes in mood.  MUSCULOSKELETAL: See HPI.     PHYSICAL EXAMINATION:  Gen: appears stated age, NAD, well-developed  Psych: normal affect, normal mood  Head: AT, NC   Eyes: conjunctiva clear, non-icteric  Pulm: non-labored, no acute respiratory distress  Neuro: alert and oriented      IMAGING: Reviewed  No results found for this or any previous visit (from the past 824799999 hours).  Recent Results (from the past 824799999 hours)   MRI SPINE THORACIC WO CONTRAST    Collection Time: 12/25/23  3:26 PM    Narrative    SELINDA A Privitera  RADIOLOGISTBETHA [facility] Shoemaker    MRI SPINE THORACIC WO CONTRAST performed on 12/25/2023 3:26 PM    CLINICAL HISTORY: M54.12: Cervical radiculopathy  G89.4: Chronic pain syndrome.  chronic back pain crushing type injury 2 years ago    TECHNIQUE:  Noncontrast thoracic spine MRI.    COMPARISON:  None    FINDINGS:   Vertebrae:  Thoracic vertebral body heights are preserved.   Bone marrow signal is unremarkable.    Alignment:  Normal.  No spondylolisthesis.    Spinal Cord:  Thoracic spinal cord is of normal size and signal intensities.    Disc spaces:  Small disc bulges at multiple levels including T6-T7, T7-T8, and T10-T11. There is no significant neural compromise.    Paraspinal Tissues:  Unremarkable.        Impression    MILD THORACIC DEGENERATIVE CHANGES. NO EVIDENCE SIGNIFICANT CENTRAL CANAL STENOSIS.            Radiologist location ID: [id number]       Recent Results (from the past 824799999 hours)   MRI SPINE CERVICAL WO CONTRAST    Collection Time: 09/01/23  5:40 PM    Narrative    SELINDA A Stanbrough    MRI SPINE CERVICAL WO CONTRAST performed on 09/01/2023 5:40 PM.    INDICATION:   M54.12: Cervical radiculitis  G89.4: Chronic pain syndrome  M47.812: Cervical spondylosis   Additional History:  Neck pain radiating to RUE with right hand numbness s/p injury x2 years ago    TECHNIQUE:  Multiplanar, multisequence MRI cervical spine performed without contrast.     COMPARISON: CT imaging May 27, 2022.    FINDINGS:  The prevertebral soft tissues are within normal limits.  The paraspinal soft tissues are unremarkable.    No acute fracture or subluxation. Vertebral body heights are preserved, and facet joints are aligned.  No focal pathologic marrow lesion. Multilevel degenerative disc desiccation.    The posterior fossa is unremarkable.  Normal cord signal without evidence of syrinx.  No definite spinal canal mass.    Degenerative Changes:  C2/3: No significant stenosis.  C3/4: Uncovertebral spurring. Right-sided hypertrophy. Moderate right foraminal stenosis.  C4/5: Mild uncovertebral spurring. Mild bilateral foraminal narrowing.  C5/6: No significant stenosis.  C6/7: No significant stenosis.  C7/T1: No significant stenosis.      Impression    No acute fracture. Normal cord signal.  Very mild degenerative changes as above.  .  .  .  s/d/g        Radiologist location ID: [id number]          LABS:  CBC  Diff   No results found for: WBC, WBCJ, HGB, HCT, PLTCNT, SEDRATE, ESR, RBC, MCV, MCHC, MCH, RDW, MPV No results found for: PMNS, LYMPHOCYTES, EOSINOPHIL, MONOCYTES, BASOPHILS, PMNABS, LYMPHSABS, EOSABS, MONOSABS, BASOSABS, BASABS     COMPREHENSIVE METABOLIC PANEL  No results found for: SODIUM, POTASSIUM, CHLORIDE, CO2, ANIONGAP, BUN, CREATININE, GLUCOSENF, GLUCOSE, CALCIUM, PHOSPHORUS, ALB, ALBUMIN, TOTALPROTEIN, ALKPHOS, AST, ALT, GFR    ASSESSMENT:    ICD-10-CM    1. Bilateral occipital neuralgia  M54.81 64405 - INJ ANESTHETIC/STEROID; GREATER OCCIPITAL NERVE (AMB ONLY-PD)     64405- INJ ANESTHETIC/STEROID;  GREATER OCCIPITAL NERVE BLOCK; 64405 Prior Auth/Scheduling Procedure      2. 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 Cervicogenic headache  G44.86 64405 - INJ ANESTHETIC/STEROID; GREATER OCCIPITAL NERVE (AMB ONLY-PD)     64405- INJ ANESTHETIC/STEROID; GREATER OCCIPITAL NERVE BLOCK; 64405 Prior Auth/Scheduling Procedure        PLAN/RECOMMENDATION:  Orders Placed This Encounter    64405 - INJ ANESTHETIC/STEROID; GREATER OCCIPITAL NERVE (AMB ONLY-PD)    64405- INJ ANESTHETIC/STEROID; GREATER OCCIPITAL NERVE BLOCK; 64405 Prior Auth/Scheduling Procedure     Ordered bilateral LON/GON block #2.   -Not on AC therapy.   -No pacemaker or implantable devices.   Patient reports bad experience with cervical medial branch ablation in the past and wants to proceed with cryoablation if second diagnostic block is effective.   Follow up after injection or sooner if needed.    Patient informed of red flags symptoms like caudal anesthesia, bowel, bladder incontinence including the inability to urinate, weakness of legs that they need to go to the ED emergently for possible impingement of the spinal nerve roots and may need urgent decompression. Patient confirmed understanding.     Patient informed of red flags symptoms like caudal anesthesia, bowel, bladder incontinence including the inability to urinate, weakness of legs that they need to go to the ED emergently for possible impingement of the spinal nerve roots and may need urgent decompression. Patient confirmed  understanding.     11 minutes of medical discussion and decision making spent with the patient via telemedicine and chart review.    Our impression, treatment recommendations, and plan from today's visit were reviewed in detail with the patient. All of the patient's questions were answered and wishes to move forward with above noted plan.   The patient was seen independently with a physician available via phone if needed.    Heather DELENA Fellows, APRN, CNP, 04/01/2024       [1] No Known Allergies  [2]   Social History  Tobacco Use    Smoking status: Former     Types: Cigarettes    Smokeless tobacco: Never   Vaping Use    Vaping status: Never Used   Substance Use Topics    Alcohol use: Yes     Comment: occasionally    Drug use: Never

## 2024-04-08 ENCOUNTER — Encounter (INDEPENDENT_AMBULATORY_CARE_PROVIDER_SITE_OTHER): Payer: Self-pay

## 2024-04-09 ENCOUNTER — Telehealth (INDEPENDENT_AMBULATORY_CARE_PROVIDER_SITE_OTHER): Payer: Self-pay | Admitting: ANESTHESIOLOGY

## 2024-04-09 NOTE — Telephone Encounter (Signed)
 CM contacted PT and went over Non Fasting guidelines for upcoming procedure on 12.10.25 with Dr Woodie. PT states they are not currently taking antibiotics. Pt was instructed to inform the [facility]) if they start any antibiotics. Between their guideline review date and  up to and including their appt date to notify staff ASAP. Because their appt may be canceled or rescheduled. PT had no further questions at this time Clinic phone number was provided to PT at this time.    04/09/2024  Toribio Molt, CASE MANAGER

## 2024-04-24 ENCOUNTER — Ambulatory Visit (INDEPENDENT_AMBULATORY_CARE_PROVIDER_SITE_OTHER): Payer: Self-pay | Admitting: ANESTHESIOLOGY

## 2024-05-01 ENCOUNTER — Encounter (INDEPENDENT_AMBULATORY_CARE_PROVIDER_SITE_OTHER): Payer: Self-pay | Admitting: Neurology

## 2024-05-13 ENCOUNTER — Ambulatory Visit (INDEPENDENT_AMBULATORY_CARE_PROVIDER_SITE_OTHER): Payer: MEDICAID

## 2024-05-15 ENCOUNTER — Telehealth (INDEPENDENT_AMBULATORY_CARE_PROVIDER_SITE_OTHER): Payer: Self-pay | Admitting: ANESTHESIOLOGY

## 2024-05-15 ENCOUNTER — Encounter (INDEPENDENT_AMBULATORY_CARE_PROVIDER_SITE_OTHER): Payer: Self-pay

## 2024-05-15 NOTE — Telephone Encounter (Signed)
 CM contacted PT to go over guidelines. But the pt did not want to go over guidelines. The pt wanted to speak to someone about their insurance. CM sent a message to [facility] insurance specialist.    05/15/2024  Toribio Molt, CASE MANAGER

## 2024-05-29 ENCOUNTER — Ambulatory Visit (INDEPENDENT_AMBULATORY_CARE_PROVIDER_SITE_OTHER): Payer: MEDICAID | Admitting: ANESTHESIOLOGY

## 2024-05-29 ENCOUNTER — Other Ambulatory Visit: Payer: Self-pay

## 2024-05-29 ENCOUNTER — Encounter (INDEPENDENT_AMBULATORY_CARE_PROVIDER_SITE_OTHER): Payer: Self-pay

## 2024-05-29 ENCOUNTER — Ambulatory Visit: Payer: MEDICAID

## 2024-05-29 ENCOUNTER — Encounter (INDEPENDENT_AMBULATORY_CARE_PROVIDER_SITE_OTHER): Payer: Self-pay | Admitting: ANESTHESIOLOGY

## 2024-05-29 VITALS — BP 162/102 | HR 78 | Temp 98.6°F | Resp 20 | Wt 152.0 lb

## 2024-05-29 VITALS — Temp 97.3°F | Wt 154.9 lb

## 2024-05-29 DIAGNOSIS — M7918 Myalgia, other site: Secondary | ICD-10-CM

## 2024-05-29 DIAGNOSIS — M791 Myalgia, unspecified site: Secondary | ICD-10-CM | POA: Insufficient documentation

## 2024-05-29 DIAGNOSIS — G43709 Chronic migraine without aura, not intractable, without status migrainosus: Secondary | ICD-10-CM | POA: Insufficient documentation

## 2024-05-29 DIAGNOSIS — M542 Cervicalgia: Secondary | ICD-10-CM

## 2024-05-29 DIAGNOSIS — G8929 Other chronic pain: Secondary | ICD-10-CM | POA: Insufficient documentation

## 2024-05-29 DIAGNOSIS — G4486 Cervicogenic headache: Secondary | ICD-10-CM

## 2024-05-29 DIAGNOSIS — M5481 Occipital neuralgia: Secondary | ICD-10-CM | POA: Insufficient documentation

## 2024-05-29 NOTE — Procedures (Signed)
 [facility]  [address]  [facility] [place] [address]  Operated by [facility]  Procedure Note    Name: [name] MRN:  [id number]   Date: 05/29/2024 DOB:  11/25/1980 (43 y.o.)         20553 - INJ SINGLE OR MULTIPLE TRIGGER POINTS, 3 OR MORE MUSCLE (AMB ONLY)    Performed by: Woodie Katz, MD  Authorized by: Ronnald Heather DELENA, APRN, CNP    Time Out:     Immediately before the procedure, a time out was called:  Yes    Patient verified:  Yes    Procedure Verified:  Yes    Site Verified:  Yes    Trigger Point Injections:    Pre Procedure Dx:  myofascial pain    Post Procedure Dx:  myofascial pain    Muscles:   Bilateral trapezius, bilateral bilateral cervical paraspinals, bilateral splenius capitus, bilateral levator scapulae    Injectate: 0.25% bupivacaine  10 mL    TPs palpated and marked.  Skin prepped with alcohol.  Using a 27 gauge needle the above solution was injected in the above listed muscles.  Patient tolerated well.       Katz Woodie, MD

## 2024-05-29 NOTE — Nursing Note (Signed)
[place] Pain Rating Scale     On a scale of 0-10, during the past 24 hours, pain has interfered with you usual activity: 8     On a scale of 0-10, during the past 24 hours, pain has interfered with your sleep: 9    On a scale of 0-10, during the past 24 hours, pain has affected your mood: 10     On a scale of 0-10, during the past 24 hours, pain has contributed to your stress: 9     On a scale of 0-10, what is your overall pain Rating: 8

## 2024-05-29 NOTE — Progress Notes (Signed)
 [facility]  [address], [address]  [place], [place] [address]  [phone]    Progress Note    BUTLER VEGH  MRN: [id number]  DOB: Jun 09, 1980  Date of Service: 05/29/2024    CHIEF COMPLAINT  Chief Complaint   Patient presents with    Neck Pain    Shoulder Pain    Arm Pain    Hand/Wrist Pain     SUBJECTIVE  [name] is a 44 y.o. male who presents to clinic for follow up.  At last visit, patient was ordered bilateral LON/GON block #2. Insurance has denied procedure and will not approve RFA or cryoablation. Patient continues to describe pain as below. Reports neck pain that radiates down to bilateral elbows. Describes pain as burning. Reports he had TPI's in the past with Neurology and they were helpful. Continues to have worsening headaches. Was prescribed Flexeril  by Neurology and reports that was helpful. Denies red flag signs. Denies recent falls.     Previous update on 04/01/24:  [name] is a 44 y.o. male who was contacted today via telemedicine for procedure follow up. Patient had bilateral LON and GON block #1 performed on 03/19/24. Patient endorses 80% relief 1 day(s). Patient denies any side effects or complication with the procedure. Denies red flag signs. Denies recent falls.      Previous HPI from Dr. Tempie on 03/04/24: [name] is a 44 y.o. male who presents for initial evaluation of the above-stated pain complaint as referred by Lindsey Keczan, APRN, CNP.     Charts Reviewed:  02/19/2024 - Office Visit with Keczan, Lindsey, APRN, CNP  I had the pleasure of seeing [name] in follow-up at the [facility] on 02/19/2024. He has a history of Nephrolithiasis (10-12 episodes over a 10 year period) .      Today he reports that his headaches have not changed since their last visit 11/22/2023.  Reports amitriptyline  causes fatigue but this was tolerable. Reports he took this consistently for several weeks ( not 90 days) now takes this  as needed, noting minimal difference in headache/neck pain.   Cyclobenzaprine  is somewhat helpful for neck pain and sharp shooting pain.   Reports he did complete PT with minimal improvement.   Had appointment with neurosurgery this morning and reports surgery is not indicated at this time.   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      Preventative: amitriptyline  30 mg qHS    Abortive: cyclobenzaprine  5-10 mg TID       Severe Headaches: 4 per week  Total Headaches: 30 per month     Neck pain:   Neck pain is more bothersome, seems to trigger headaches. Baseline sore, aching neck pain like when you need to pop your neck. Stabbing pain from base of skull down neck to back. Pain is constant, daily. Becomes severe 10/10 at some point throughout the day. Triggered with increased activity, carrying heavy objects, wearing back pack. Pins and needles like TENS unit sensation down R>L arms, worse in hands. Decreased strength in hands and arms. Shooting pain down bilateral legs.      Headaches:  Frequency: Daily since July 2023, severe at some point every day  Intensity:  Severe: 10/10. Baseline: 6-7/10.  Duration: Severe pain lasts at least 4 hours without PRNs  Location and character: Occipital tightness at baseline. Becomes global when severe     Previously tried abortive medications:   - Acetaminophen , ibuprofen   -  Ketorolac  10 mg PO - helpful  - Cyclobenzaprine  - somewhat helpful  - Methocarbamol  750 mg - somewhat helpful     Previously tried prophylactic medications:   - Duloxetine - possibly years ago.     Previously tried procedures: cervical ablation - worsened neck pain  Other:      Past history (from 11/22/2023 with Dr [name]):  History of Present Illness: ?  [name] is a 44 y.o. yo, right handed male who presents for evaluation of headaches.? No history of heart attack, stroke, asthma, or diabetes. Nephrolithiasis (10-12 episodes over a 10 year period). No family history of headaches.     Prior headache  history:  Sinus headaches intermittently, resolved with starting allergy medication; no photophobia, phonophobia, or nausea. History of head injury in his teens, no change in headaches or neck pain with that.      July 2023 was at [facility], a rick of lumber fell from 8 feet, hit head, LOC. Vision blacked out for a few minutes, which improved soon after. Neck pain, headache started immediately after head injury. Also has been having numbness, tingling down R>L arm, between shoulder blades.      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 MBBB helpful for a few hours, ablation 2024 worsened pain.      Initial presentation to clinic:  Neck pain:   Neck pain is more bothersome, seems to trigger headaches. Baseline sore, aching neck pain like when you need to pop your neck. Stabbing pain from base of skull down neck to back. Pain is constant, daily. Becomes severe 10/10 at some point throughout the day. Triggered with increased activity, carrying heavy objects, wearing back pack. Pins and needles like TENS unit sensation down R>L arms, worse in hands. Decreased strength in hands and arms. Shooting pain down bilateral legs.      Headaches:  Frequency: Daily since July 2023, severe at some point every day  Intensity:  Severe: 10/10. Baseline: 6-7/10.  Duration: Severe pain lasts at least 4 hours without PRNs  Location and character: Occipital tightness at baseline. Becomes global when severe     Prodrome:   Associated symptoms: Irritable. Photophobia, phonophobia, kinesiophobia with severe headaches. No nausea.  - No rhinorrhea, lacrimation, ptosis, miosis, or conjunctival injection.  - Difficulty thinking with severe pain.  Some intermittent lightheadedness, not frequent. No vertigo during severe headache.   Postdrome:      Vision: No visual aura. No loss of vision. Intermittent blurred vision with severe headache.  Hearing: No pulsatile tinnitus. High-pitched non-pulsatile tinnitus that waxes and  wanes.   Sensory/motor: Baseline paresthesias and arm weakness.      Positional: Not clearly positional. Headaches aren't worse at a particular time of day.  Valsalva: Not triggered with cough, sneeze, or valsalva.  Jaw claudication: None  Sleep: Pain typically keeps him from sleeping and wakes him from sleep. Sleeps 4-5 hours a night. Doesn't snore. No observed apneas.      Triggers: increased activity  Alleviating factors: heat  PRNs upon presentation: ibuprofen  (800 mg 1-2/day, daily since 2023), acetaminophen  (15/month)     Water: at least 6-8 glasses of water  Caffeine: 2 cups of coffee     Results of previous testing:      MRI c-spine wo contrast 09/01/23: No significant stenosis except moderate right foraminal stenosis at C3-C4. Right sided hypertrophy C3-C4.      MRI spine thoracic from 12/25/2023  FINDINGS:   Vertebrae:  Thoracic vertebral body  heights are preserved.   Bone marrow signal is unremarkable.  Alignment:  Normal.  No spondylolisthesis.  Spinal Cord:  Thoracic spinal cord is of normal size and signal intensities.  Disc spaces:  Small disc bulges at multiple levels including T6-T7, T7-T8, and T10-T11. There is no significant neural compromise.  Paraspinal Tissues:  Unremarkable.     IMPRESSION:  MILD THORACIC DEGENERATIVE CHANGES. NO EVIDENCE SIGNIFICANT CENTRAL CANAL STENOSIS.        03/04/24, [facility]  [facility]  History of Present Illness  [name] is a 44 year old male who presents for pain management consultation due to chronic neck pain and headaches.     He has experienced chronic neck pain and headaches since July 2023 after a rick of lumber fell on his head, causing loss of consciousness. The neck pain is sore, aching, and stabbing, radiating from the base of the skull down the neck and back, with a constant intensity of 10 out of 10 throughout the day. The pain is exacerbated by increased activity, carrying heavy objects, and  wearing a backpack. He also experiences pins and needles sensations down the right more than the left arm, worse in his hands, and shooting pain down both legs.     He experiences approximately four severe headaches per week, totaling about thirty headaches per month, often triggered by neck pain. Various abortive medications including Tylenol , ibuprofen , Toradol , Flexeril , and Robaxin  have been somewhat helpful. Amitriptyline  was tried but caused fatigue. He has also tried duloxetine several years ago. A cervical ablation was performed but worsened his neck pain.     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 An MRI of the cervical spine in April 2025 was performed; the patient was later informed that there were some issues seen on the MRI. He has undergone physical therapy without significant improvement.     He describes additional symptoms including numbness and tingling in the fingers, predominantly in digits one through three on the right side. 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 He also notes a sensation akin to a TENS unit on his shoulder blades and experiences sudden hot needle-like pain in the upper shoulder area.     He has tried various treatments including cervical RFA C3 through 5 bilaterally in December 2024, which worsened his neck pain. He has also had temporary diagnostic injections that provided initial numbness but no lasting relief. He reports a decrease in muscle mass since the onset of his symptoms, impacting his physical appearance and work capabilities.     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 He resides in [place] , [place] area.     TREATMENT HISTORY:        Helpful Not Helpful Not Tried  Comments   MBB           RFA/Cryoablation           Epidural           Transforaminal/  Nerve root block            SI injection           Intraarticular Joint            Sympathetic Block            Other  X     03/19/24- bilateral LON and GON block #1  80% relief  1 day(s)   TPI           GTB            SCS           Pain pump              Physical Therapy or HEP            Massage Therapy           Acupuncture            Chiropractor           Exercise Physiologist            Counseling/CBT             Medications:   ADDERALL 10 mg Oral Tablet, Take 1 Tablet (10 mg total) by mouth Daily  amitriptyline  (ELAVIL ) 25 mg Oral Tablet, Take one tablet by mouth every night x7 days, then take 1 1/2 tabs by mouth every night x7 days, then take two tablets nightly there after. Goal dose 50 mg nightly.  azelastine  (ASTELIN ) 137 mcg (0.1 %) Nasal Aerosol, Spray, Administer 2 Sprays into each nostril Once a day Use in each nostril as directed  cetirizine  (ZYRTEC ) 10 mg Oral Tablet, Take 1 Tablet (10 mg total) by mouth Once a day  clonazePAM (KLONOPIN) 0.5 mg Oral Tablet, Take 1 Tablet (0.5 mg total) by mouth Three times a day as needed  cyclobenzaprine  (FLEXERIL ) 5 mg Oral Tablet, 1/2-2 tabs TID PRN headache  lidocaine  (XYLOCAINE ) 5 % Ointment, Apply topically Three times a day as needed  montelukast  (SINGULAIR ) 10 mg Oral Tablet, Take 1 Tablet (10 mg total) by mouth Once a day  ondansetron  (ZOFRAN  ODT) 4 mg Oral Tablet, Rapid Dissolve, Take 1 Tablet (4 mg total) by mouth Every 8 hours as needed for Nausea/Vomiting  pantoprazole (PROTONIX) 40 mg Oral Tablet, Delayed Release (E.C.), Take 1 Tablet (40 mg total) by mouth Twice daily    No facility-administered medications prior to visit.    Allergies:   Allergies[1]    Review of Systems:   CONSTITUTIONAL: Denies weight change. Denies fever.   GI: Denies bowel incontinence.  GU: Denies urinary incontinence or retention.  NEUROLOGICAL: Denies paralysis. Denies tremors    PSYCHIATRIC: Denies acute changes in mood.  MUSCULOSKELETAL: See HPI.    OBJECTIVE  BP (!) 162/102   Pulse 78   Temp 37 C (98.6 F)   Resp 20   Wt 68.9 kg (152 lb)   SpO2 100%   BMI 21.81 kg/m      General: no distress   Abdomen: soft, non-tender and non-distended   Respiratory: No increased work of breathing. No increased accessory muscle use   Cardiovascular: acyanotic, no JVD  Skin: warm and dry, no rash  Neurologic: gait is normal , motor 5/5 and sensory intact in b/l UEs, AOx3, CN 2-12 grossly intact, negative Hoffman's b/l  Deep Tendon Reflexes    Brachioradialis Bicep Patellar Achilles   Right  2+ 2+ 2+ 2+   Left 2+ 2+ 2+ 2+     Psychiatric: normal affect and behavior  Musculoskeletal: no cyanosis or edema    TTP bilateral LON/GON distribution    Cervical   ROM: Limited extension and Limited rotation   Palpation:  Tender   Motor: 5/5 in UEs b/l   Sensory: Intact in UEs b/l   Facet loading: Positive bilaterally    Facet TTP: Positive bilaterally    Trigger points: Cervical  paraspinal bilateral and Trapezius bilateral   Spurling's: Negative bilaterally     Nursing Notes:   Gus Perfect, Ambulatory Care Assistant  05/29/24 1324  Signed  Return Patient Visit    Selinda DELENA Rather    Chief Complaint   Patient presents with    Neck Pain    Shoulder Pain    Arm Pain    Hand/Wrist Pain         [place] Pain Rating Scale     On a scale of 0-10, during the past 24 hours, pain has interfered with you usual activity: 8     On a scale of 0-10, during the past 24 hours, pain has interfered with your sleep: 9    On a scale of 0-10, during the past 24 hours, pain has affected your mood: 10     On a scale of 0-10, during the past 24 hours, pain has contributed to your stress: 9     On a scale of 0-10, what is your overall pain Rating: 8        Vitals:    05/29/24 1318   BP: (!) 162/102   Pulse: 78   Resp: 20   Temp: 37 C (98.6 F)   SpO2: 100%   Weight: 68.9 kg (152 lb)   PainSc:   8   PainLoc: Neck       Body mass index is 21.81 kg/m.    New imaging since last visit:    Reason for today's visit: Insurance denied procedure, wants to discuss further options     Perfect Gus, Ambulatory Care Assistant  05/29/2024,  13:21      Imaging: Reviewed    No results found for this or any previous visit (from the past 824799999 hours).  Recent Results (from the past 824799999 hours)   MRI SPINE THORACIC WO CONTRAST    Collection Time: 12/25/23  3:26 PM    Narrative    SELINDA A Marano    RADIOLOGIST: Lynwood Shoemaker    MRI SPINE THORACIC WO CONTRAST performed on 12/25/2023 3:26 PM    CLINICAL HISTORY: M54.12: Cervical radiculopathy  G89.4: Chronic pain syndrome.  chronic back pain crushing type injury 2 years ago    TECHNIQUE:  Noncontrast thoracic spine MRI.    COMPARISON:  None    FINDINGS:   Vertebrae:  Thoracic vertebral body heights are preserved.   Bone marrow signal is unremarkable.    Alignment:  Normal.  No spondylolisthesis.    Spinal Cord:  Thoracic spinal cord is of normal size and signal intensities.    Disc spaces:  Small disc bulges at multiple levels including T6-T7, T7-T8, and T10-T11. There is no significant neural compromise.    Paraspinal Tissues:  Unremarkable.        Impression    MILD THORACIC DEGENERATIVE CHANGES. NO EVIDENCE SIGNIFICANT CENTRAL CANAL STENOSIS.            Radiologist location ID: [id number]       Recent Results (from the past [id number] hours)   MRI SPINE CERVICAL WO CONTRAST    Collection Time: 09/01/23  5:40 PM    Narrative    SELINDA A Dowland    MRI SPINE CERVICAL WO CONTRAST performed on 09/01/2023 5:40 PM.    INDICATION:  M54.12: Cervical radiculitis  G89.4: Chronic pain syndrome  M47.812: Cervical spondylosis   Additional History:  Neck pain radiating to RUE with right hand numbness s/p injury x2 years ago    TECHNIQUE:  Multiplanar, multisequence MRI cervical spine performed without contrast.     COMPARISON: CT imaging May 27, 2022.    FINDINGS:  The prevertebral soft tissues are within normal limits.  The paraspinal soft tissues are unremarkable.    No acute fracture or subluxation. Vertebral body heights are preserved, and facet joints are aligned.  No focal pathologic marrow lesion.  Multilevel degenerative disc desiccation.    The posterior fossa is unremarkable.  Normal cord signal without evidence of syrinx.  No definite spinal canal mass.    Degenerative Changes:  C2/3: No significant stenosis.  C3/4: Uncovertebral spurring. Right-sided hypertrophy. Moderate right foraminal stenosis.  C4/5: Mild uncovertebral spurring. Mild bilateral foraminal narrowing.  C5/6: No significant stenosis.  C6/7: No significant stenosis.  C7/T1: No significant stenosis.      Impression    No acute fracture. Normal cord signal.  Very mild degenerative changes as above.  .  .  .  s/d/g        Radiologist location ID: [id number]       ASSESSMENT    ICD-10-CM    1. Bilateral occipital neuralgia  M54.81       2. 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 Chronic migraine without aura without status migrainosus, not intractable  G43.709       4. Cervicogenic headache  G44.86 20553 - INJ SINGLE OR MULTIPLE TRIGGER POINTS, 3 OR MORE MUSCLE -No Imaging; 79446 Prior Auth/Scheduling Procedure     20553 - INJ SINGLE OR MULTIPLE TRIGGER POINTS, 3 OR MORE MUSCLE (AMB ONLY)      5. Trigger point of neck  M54.2 20553 - INJ SINGLE OR MULTIPLE TRIGGER POINTS, 3 OR MORE MUSCLE -No Imaging; 79446 Prior Auth/Scheduling Procedure     20553 - INJ SINGLE OR MULTIPLE TRIGGER POINTS, 3 OR MORE MUSCLE (AMB ONLY)      6. Myalgia  M79.10 20553 - INJ SINGLE OR MULTIPLE TRIGGER POINTS, 3 OR MORE MUSCLE -No Imaging; 79446 Prior Auth/Scheduling Procedure     20553 - INJ SINGLE OR MULTIPLE TRIGGER POINTS, 3 OR MORE MUSCLE (AMB ONLY)      7. Myofascial pain  M79.18 20553 - INJ SINGLE OR MULTIPLE TRIGGER POINTS, 3 OR MORE MUSCLE -No Imaging; 79446 Prior Auth/Scheduling Procedure     20553 - INJ SINGLE OR MULTIPLE TRIGGER  POINTS, 3 OR MORE MUSCLE (AMB ONLY)        PLAN/RECOMMENDATION  Orders Placed This Encounter    79446 - INJ SINGLE OR MULTIPLE TRIGGER POINTS, 3 OR MORE MUSCLE (AMB ONLY)    20553 - INJ SINGLE OR MULTIPLE TRIGGER POINTS, 3 OR MORE MUSCLE -No Imaging; 79446 Prior Auth/Scheduling Procedure     Ordered bilateral cervical paraspinals and trapezius with Dr. Woodie.   Instructed patient to schedule follow up appointment with Neurology for medication management.   Could consider possible C7/T1 ESI in the future if more prominent radicular/neuropathic symptoms.   Follow up after injection or sooner if needed.     Patient informed of red flags symptoms like caudal anesthesia, bowel, bladder incontinence including the inability to urinate, weakness of legs that they need to go to the ED emergently for possible impingement of the spinal nerve roots and may need urgent decompression. Patient  confirmed understanding.   Our impression, treatment recommendations and plan from today's visit were reviewed in detail with the patient in the office. All of the patients questions were answered. If a procedure was ordered, it was explained using a spine model,  including technique, benefits, alternatives and the associated risks.   The patient verbalized understanding of the above plan and the patient wishes to move forward with the above noted plan.    Heather DELENA Fellows, APRN, CNP 05/29/2024, 13:56         [1] No Known Allergies

## 2024-05-29 NOTE — Patient Instructions (Signed)
 [facility]  [address]  [place] [address]  Dept: [phone]  Dept Fax: [phone]  [phone]                                                 SPECIAL PROCEDURES                                     DISCHARGE FORM                                          [phone]      Please follow the instructions listed below for your procedures.  If you have questions concerning your procedure, you may call and leave a message.  Messages will be returned by the end of the next business day.  If you have an emergency, proceed to your local Emergency Department.      PROCEDURE: TRIGGER POINT INJECTION    Do not drive a car or operate machinery until tomorrow.  Rest today and return to normal activities tomorrow.  If you are on restricted activities by your physician, please continue to follow these.  If you are not sure, contact your physician.  If you have soreness at the injection site, the application of heat or ice may be helpful. Mild analgesics may also be used.    These instructions have been reviewed with the patient and appropriate questions have answers.  Zane Music, Ambulatory Care Assistant 05/29/2024 14:13

## 2024-05-29 NOTE — Nursing Note (Signed)
 Return Patient Visit    [facility]    Chief Complaint   Patient presents with    Neck Pain    Shoulder Pain    Arm Pain    Hand/Wrist Pain          Pain Rating Scale     On a scale of 0-10, during the past 24 hours, pain has interfered with you usual activity: 8     On a scale of 0-10, during the past 24 hours, pain has interfered with your sleep: 9    On a scale of 0-10, during the past 24 hours, pain has affected your mood: 10     On a scale of 0-10, during the past 24 hours, pain has contributed to your stress: 9     On a scale of 0-10, what is your overall pain Rating: 8        Vitals:    05/29/24 1318   BP: (!) 162/102   Pulse: 78   Resp: 20   Temp: 37 C (98.6 F)   SpO2: 100%   Weight: 68.9 kg (152 lb)   PainSc:   8   PainLoc: Neck       Body mass index is 21.81 kg/m.    New imaging since last visit:    Reason for today's visit: Insurance denied procedure, wants to discuss further options     Inocencio Cherry, Ambulatory Care Assistant  05/29/2024, 13:21

## 2024-06-12 ENCOUNTER — Ambulatory Visit: Payer: MEDICAID

## 2024-06-12 DIAGNOSIS — M25511 Pain in right shoulder: Secondary | ICD-10-CM

## 2024-06-12 DIAGNOSIS — M5412 Radiculopathy, cervical region: Secondary | ICD-10-CM

## 2024-06-12 DIAGNOSIS — M5481 Occipital neuralgia: Secondary | ICD-10-CM

## 2024-06-12 DIAGNOSIS — M7918 Myalgia, other site: Secondary | ICD-10-CM

## 2024-06-12 DIAGNOSIS — G4486 Cervicogenic headache: Secondary | ICD-10-CM

## 2024-06-12 DIAGNOSIS — M5030 Other cervical disc degeneration, unspecified cervical region: Secondary | ICD-10-CM

## 2024-06-12 DIAGNOSIS — M791 Myalgia, unspecified site: Secondary | ICD-10-CM

## 2024-06-12 DIAGNOSIS — G43709 Chronic migraine without aura, not intractable, without status migrainosus: Secondary | ICD-10-CM

## 2024-06-12 DIAGNOSIS — M47812 Spondylosis without myelopathy or radiculopathy, cervical region: Secondary | ICD-10-CM

## 2024-06-12 DIAGNOSIS — M25529 Pain in unspecified elbow: Secondary | ICD-10-CM

## 2024-06-12 DIAGNOSIS — M542 Cervicalgia: Secondary | ICD-10-CM

## 2024-06-12 NOTE — Progress Notes (Signed)
 [facility]  [address]  [place], [place] [address]  [phone]        TELEMEDICINE DOCUMENTATION: Video visit  Patient Location: Home address   Patient/family aware of provider location:  Yes  Patient/family consent for telemedicine:  Yes  Examination observed and performed by:  Heather DELENA Fellows, APRN, CNP    CHIEF COMPLAINT:  Chief Complaint   Patient presents with    Follow Up After Injection      HISTORY OF PRESENT ILLNESS:   [name] is a 44 y.o. male who was contacted today via telemedicine for procedure follow up. Patient had bilateral trapezius, bilateral bilateral cervical paraspinals, bilateral splenius capitus, bilateral levator scapulae TPI's by Dr. Woodie performed on 05/29/24. Patient endorses 30% relief couple days. Patient denies any side effects or complication with the procedure. 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 Continues to report neck pain, myofascial pain, bilateral shoulder pain, and bilateral elbow pain. Reports when the weather is cold his hands go numb. Denies red flag signs. Denies recent falls.     Previous update on 05/29/24:  [name] is a 44 y.o. male who presents to clinic for follow up.  At last visit, patient was ordered bilateral LON/GON block #2. Insurance has denied procedure and will not approve RFA or cryoablation. Patient continues to describe pain as below. Reports neck pain that radiates down to bilateral elbows. Describes pain as burning. Reports he had TPI's in the past with Neurology and they were helpful. Continues to have worsening headaches. Was prescribed Flexeril  by Neurology and reports that was helpful. Denies red flag signs. Denies recent falls.      Previous update on 04/01/24:  [name] is a 44 y.o. male who was contacted today via telemedicine for procedure follow up. Patient had bilateral LON and GON block #1 performed on 03/19/24. Patient endorses 80% relief 1 day(s).  Patient denies any side effects or complication with the procedure. Denies red flag signs. Denies recent falls.      Previous HPI from Dr. Tempie on 03/04/24: [name] is a 44 y.o. male who presents for initial evaluation of the above-stated pain complaint as referred by Lindsey Keczan, APRN, CNP.     Charts Reviewed:  02/19/2024 - Office Visit with Keczan, Lindsey, APRN, CNP  I had the pleasure of seeing KIOWA HOLLAR in follow-up at the [facility] on 02/19/2024. He has a history of Nephrolithiasis (10-12 episodes over a 10 year period) .      Today he reports that his headaches have not changed since their last visit 11/22/2023.  Reports amitriptyline  causes fatigue but this was tolerable. Reports he took this consistently for several weeks ( not 90 days) now takes this as needed, noting minimal difference in headache/neck pain.   Cyclobenzaprine  is somewhat helpful for neck pain and sharp shooting pain.   Reports he did complete PT with minimal improvement.   Had appointment with neurosurgery this morning and reports surgery is not indicated at this time.   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      Preventative: amitriptyline  30 mg qHS    Abortive: cyclobenzaprine  5-10 mg TID       Severe Headaches: 4 per week  Total Headaches: 30 per month     Neck pain:   Neck pain is more bothersome, seems to trigger headaches. Baseline sore, aching neck pain like when you need  to pop your neck. Stabbing pain from base of skull down neck to back. Pain is constant, daily. Becomes severe 10/10 at some point throughout the day. Triggered with increased activity, carrying heavy objects, wearing back pack. Pins and needles like TENS unit sensation down R>L arms, worse in hands. Decreased strength in hands and arms. Shooting pain down bilateral legs.      Headaches:  Frequency: Daily since July 2023, severe at some point every day  Intensity:  Severe: 10/10. Baseline: 6-7/10.  Duration: Severe pain lasts at  least 4 hours without PRNs  Location and character: Occipital tightness at baseline. Becomes global when severe     Previously tried abortive medications:   - Acetaminophen , ibuprofen   - Ketorolac  10 mg PO - helpful  - Cyclobenzaprine  - somewhat helpful  - Methocarbamol  750 mg - somewhat helpful     Previously tried prophylactic medications:   - Duloxetine - possibly years ago.     Previously tried procedures: cervical ablation - worsened neck pain  Other:      Past history (from 11/22/2023 with Dr Wilfred):  History of Present Illness: ?  [name] is a 44 y.o. yo, right handed male who presents for evaluation of headaches.? No history of heart attack, stroke, asthma, or diabetes. Nephrolithiasis (10-12 episodes over a 10 year period). No family history of headaches.     Prior headache history:  Sinus headaches intermittently, resolved with starting allergy medication; no photophobia, phonophobia, or nausea. History of head injury in his teens, no change in headaches or neck pain with that.      July 2023 was at [facility], a rick of lumber fell from 8 feet, hit head, LOC. Vision blacked out for a few minutes, which improved soon after. Neck pain, headache started immediately after head injury. Also has been having numbness, tingling down R>L arm, between shoulder blades.      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 MBBB helpful for a few hours, ablation 2024 worsened pain.      Initial presentation to clinic:  Neck pain:   Neck pain is more bothersome, seems to trigger headaches. Baseline sore, aching neck pain like when you need to pop your neck. Stabbing pain from base of skull down neck to back. Pain is constant, daily. Becomes severe 10/10 at some point throughout the day. Triggered with increased activity, carrying heavy objects, wearing back pack. Pins and needles like TENS unit sensation down R>L arms, worse in hands. Decreased strength in hands and arms. Shooting pain down  bilateral legs.      Headaches:  Frequency: Daily since July 2023, severe at some point every day  Intensity:  Severe: 10/10. Baseline: 6-7/10.  Duration: Severe pain lasts at least 4 hours without PRNs  Location and character: Occipital tightness at baseline. Becomes global when severe     Prodrome:   Associated symptoms: Irritable. Photophobia, phonophobia, kinesiophobia with severe headaches. No nausea.  - No rhinorrhea, lacrimation, ptosis, miosis, or conjunctival injection.  - Difficulty thinking with severe pain.  Some intermittent lightheadedness, not frequent. No vertigo during severe headache.   Postdrome:      Vision: No visual aura. No loss of vision. Intermittent blurred vision with severe headache.  Hearing: No pulsatile tinnitus. High-pitched non-pulsatile tinnitus that waxes and wanes.   Sensory/motor: Baseline paresthesias and arm weakness.      Positional: Not clearly positional. Headaches aren't worse at a particular time of day.  Valsalva: Not triggered with  cough, sneeze, or valsalva.  Jaw claudication: None  Sleep: Pain typically keeps him from sleeping and wakes him from sleep. Sleeps 4-5 hours a night. Doesn't snore. No observed apneas.      Triggers: increased activity  Alleviating factors: heat  PRNs upon presentation: ibuprofen  (800 mg 1-2/day, daily since 2023), acetaminophen  (15/month)     Water: at least 6-8 glasses of water  Caffeine: 2 cups of coffee     Results of previous testing:      MRI c-spine wo contrast 09/01/23: No significant stenosis except moderate right foraminal stenosis at C3-C4. Right sided hypertrophy C3-C4.      MRI spine thoracic from 12/25/2023  FINDINGS:   Vertebrae:  Thoracic vertebral body heights are preserved.   Bone marrow signal is unremarkable.  Alignment:  Normal.  No spondylolisthesis.  Spinal Cord:  Thoracic spinal cord is of normal size and signal intensities.  Disc spaces:  Small disc bulges at multiple levels including T6-T7, T7-T8, and T10-T11. There is  no significant neural compromise.  Paraspinal Tissues:  Unremarkable.     IMPRESSION:  MILD THORACIC DEGENERATIVE CHANGES. NO EVIDENCE SIGNIFICANT CENTRAL CANAL STENOSIS.        03/04/24, PAIN MANAGEMENT-[facility]  PAIN MANAGEMENT, [facility]  History of Present Illness  WELLES WALTHALL is a 44 year old male who presents for pain management consultation due to chronic neck pain and headaches.     He has experienced chronic neck pain and headaches since July 2023 after a rick of lumber fell on his head, causing loss of consciousness. The neck pain is sore, aching, and stabbing, radiating from the base of the skull down the neck and back, with a constant intensity of 10 out of 10 throughout the day. The pain is exacerbated by increased activity, carrying heavy objects, and wearing a backpack. He also experiences pins and needles sensations down the right more than the left arm, worse in his hands, and shooting pain down both legs.     He experiences approximately four severe headaches per week, totaling about thirty headaches per month, often triggered by neck pain. Various abortive medications including Tylenol , ibuprofen , Toradol , Flexeril , and Robaxin  have been somewhat helpful. Amitriptyline  was tried but caused fatigue. He has also tried duloxetine several years ago. A cervical ablation was performed but worsened his neck pain.     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 An MRI of the cervical spine in April 2025 was performed; the patient was later informed that there were some issues seen on the MRI. He has undergone physical therapy without significant improvement.     He describes additional symptoms including numbness and tingling in the fingers, predominantly in digits one through three on the right side. 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 He also notes a sensation akin to a TENS unit on his shoulder blades and experiences  sudden hot needle-like pain in the upper shoulder area.     He has tried various treatments including cervical RFA C3 through 5 bilaterally in December 2024, which worsened his neck pain. He has also had temporary diagnostic injections that provided initial numbness but no lasting relief. He reports a decrease in muscle mass since the onset of his symptoms, impacting his physical appearance and work capabilities.     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 He resides in [place] area.  TREATMENT HISTORY:        Helpful Not Helpful Not Tried  Comments   MBB           RFA/Cryoablation           Epidural           Transforaminal/  Nerve root block            SI injection           Intraarticular Joint            Sympathetic Block           Other  X     03/19/24- bilateral LON and GON block #1  80% relief 1 day(s)   TPI   X    05/29/24-  Bilateral trapezius, bilateral bilateral cervical paraspinals, bilateral splenius capitus, bilateral levator scapulae TPI's, 30% couple days   GTB            SCS           Pain pump              Physical Therapy or HEP            Massage Therapy           Acupuncture            Chiropractor           Exercise Physiologist            Counseling/CBT             [place] Pain Rating Scale     On a scale of 0-10, during the past 24 hours, pain has interfered with you usual activity: 8      On a scale of 0-10, during the past 24 hours, pain has interfered with your sleep: 9     On a scale of 0-10, during the past 24 hours, pain has affected your mood: 10      On a scale of 0-10, during the past 24 hours, pain has contributed to your stress: 9      On a scale of 0-10, what is your overall pain Rating: 8     PAST MEDICAL/ FAMILY/ SOCIAL HISTORY:  Past Medical History:   Diagnosis Date    GERD (gastroesophageal reflux disease)      Allergies[1]  Current Outpatient Medications   Medication Sig    ADDERALL 10 mg Oral Tablet Take 1  Tablet (10 mg total) by mouth Daily    amitriptyline  (ELAVIL ) 25 mg Oral Tablet Take one tablet by mouth every night x7 days, then take 1 1/2 tabs by mouth every night x7 days, then take two tablets nightly there after. Goal dose 50 mg nightly.    azelastine  (ASTELIN ) 137 mcg (0.1 %) Nasal Aerosol, Spray Administer 2 Sprays into each nostril Once a day Use in each nostril as directed    cetirizine  (ZYRTEC ) 10 mg Oral Tablet Take 1 Tablet (10 mg total) by mouth Once a day    clonazePAM (KLONOPIN) 0.5 mg Oral Tablet Take 1 Tablet (0.5 mg total) by mouth Three times a day as needed    cyclobenzaprine  (FLEXERIL ) 5 mg Oral Tablet 1/2-2 tabs TID PRN headache    lidocaine  (XYLOCAINE ) 5 % Ointment Apply topically Three times a day as needed    montelukast  (SINGULAIR ) 10 mg Oral Tablet Take 1 Tablet (10 mg total) by mouth Once a day    ondansetron  (ZOFRAN  ODT) 4 mg Oral Tablet,  Rapid Dissolve Take 1 Tablet (4 mg total) by mouth Every 8 hours as needed for Nausea/Vomiting    pantoprazole (PROTONIX) 40 mg Oral Tablet, Delayed Release (E.C.) Take 1 Tablet (40 mg total) by mouth Twice daily     Past Surgical History:   Procedure Laterality Date    ABDOMINAL SURGERY      HX HERNIA REPAIR      HX NERVE BLOCK Bilateral 03/28/2023    BILATERAL C3, C4, C5 MBNB TO TREAT C3-4, C4-5- 2/2    LAPAROSCOPIC CHOLECYSTECTOMY      LITHOTRIPSY      RADIOFREQUENCY ABLATION Bilateral 05/01/2023    C3,C4,C5 RFA    URETERAL STENT PLACEMENT       Social History[2]  Family Medical History:       Problem Relation (Age of Onset)    No Known Problems Mother, Father          REVIEW OF SYSTEMS:   CONSTITUTIONAL: Denies weight change. Denies fever.   GI: Denies bowel incontinence.  GU: Denies urinary incontinence or retention.  NEUROLOGICAL: Denies paralysis. Denies tremors    PSYCHIATRIC: Denies acute changes in mood.  MUSCULOSKELETAL: See HPI.     PHYSICAL EXAMINATION:  Gen: appears stated age, NAD, well-developed  Psych: normal affect, normal  mood  Head: AT, NC   Eyes: conjunctiva clear, non-icteric  Pulm: non-labored, no acute respiratory distress  Neuro: alert and oriented      IMAGING: Reviewed  Recent Results (from the past [id number] hours)   MRI SPINE THORACIC WO CONTRAST    Collection Time: 12/25/23  3:26 PM    Narrative    SELINDA A Neely    RADIOLOGISTBETHA Lynwood Shoemaker    MRI SPINE THORACIC WO CONTRAST performed on 12/25/2023 3:26 PM    CLINICAL HISTORY: M54.12: Cervical radiculopathy  G89.4: Chronic pain syndrome.  chronic back pain crushing type injury 2 years ago    TECHNIQUE:  Noncontrast thoracic spine MRI.    COMPARISON:  None    FINDINGS:   Vertebrae:  Thoracic vertebral body heights are preserved.   Bone marrow signal is unremarkable.    Alignment:  Normal.  No spondylolisthesis.    Spinal Cord:  Thoracic spinal cord is of normal size and signal intensities.    Disc spaces:  Small disc bulges at multiple levels including T6-T7, T7-T8, and T10-T11. There is no significant neural compromise.    Paraspinal Tissues:  Unremarkable.        Impression    MILD THORACIC DEGENERATIVE CHANGES. NO EVIDENCE SIGNIFICANT CENTRAL CANAL STENOSIS.            Radiologist location ID: [id number]       Recent Results (from the past [id number] hours)   MRI SPINE CERVICAL WO CONTRAST    Collection Time: 09/01/23  5:40 PM    Narrative    SELINDA A Foell    MRI SPINE CERVICAL WO CONTRAST performed on 09/01/2023 5:40 PM.    INDICATION:  M54.12: Cervical radiculitis  G89.4: Chronic pain syndrome  M47.812: Cervical spondylosis   Additional History:  Neck pain radiating to RUE with right hand numbness s/p injury x2 years ago    TECHNIQUE:  Multiplanar, multisequence MRI cervical spine performed without contrast.     COMPARISON: CT imaging May 27, 2022.    FINDINGS:  The prevertebral soft tissues are within normal limits.  The paraspinal soft tissues are unremarkable.    No acute fracture or subluxation. Vertebral body heights are preserved, and facet joints  are  aligned.  No focal pathologic marrow lesion. Multilevel degenerative disc desiccation.    The posterior fossa is unremarkable.  Normal cord signal without evidence of syrinx.  No definite spinal canal mass.    Degenerative Changes:  C2/3: No significant stenosis.  C3/4: Uncovertebral spurring. Right-sided hypertrophy. Moderate right foraminal stenosis.  C4/5: Mild uncovertebral spurring. Mild bilateral foraminal narrowing.  C5/6: No significant stenosis.  C6/7: No significant stenosis.  C7/T1: No significant stenosis.      Impression    No acute fracture. Normal cord signal.  Very mild degenerative changes as above.  .  .  .  s/d/g        Radiologist location ID: [id number]         LABS:  CBC  Diff   No results found for: WBC, WBCJ, HGB, HCT, PLTCNT, SEDRATE, ESR, RBC, MCV, MCHC, MCH, RDW, MPV No results found for: PMNS, LYMPHOCYTES, EOSINOPHIL, MONOCYTES, BASOPHILS, PMNABS, LYMPHSABS, EOSABS, MONOSABS, BASOSABS, BASABS     COMPREHENSIVE METABOLIC PANEL  No results found for: SODIUM, POTASSIUM, CHLORIDE, CO2, ANIONGAP, BUN, CREATININE, GLUCOSENF, GLUCOSE, CALCIUM, PHOSPHORUS, ALB, ALBUMIN, TOTALPROTEIN, ALKPHOS, AST, ALT, GFR    ASSESSMENT:    ICD-10-CM    1. Bilateral shoulder pain  M25.511 XR SHOULDER BILATERAL ARTHRITIS SERIES    M25.512 REFERRAL TO ORTHOPAEDIC ALL [place] LOCATIONS      2. Elbow pain, unspecified laterality  M25.529 XR ELBOW BILATERAL 2 VIEW     REFERRAL TO ORTHOPAEDIC ALL [place] LOCATIONS      3. Cervical radiculopathy  M54.12 PAIN CL FL EPIDURAL STEROID INJ CERVICAL     62321-INJ DIAG/THERAP,INTERLAMINAR EPIDURAL,CERVICAL/THORACIC, W/ IMAGE GUIDANCE (AMB ONLY)      4. Cervicogenic headache  G44.86       5. Trigger point of neck  M54.2       6. Myalgia  M79.10       7. Myofascial pain  M79.18       8. Bilateral occipital neuralgia  M54.81       9. Chronic migraine without aura without status migrainosus, not  intractable  G43.709       10. 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 Cervical spondylosis without myelopathy  M47.812         PLAN/RECOMMENDATION:  Orders Placed This Encounter    62321-INJ DIAG/THERAP,INTERLAMINAR EPIDURAL,CERVICAL/THORACIC, W/ IMAGE GUIDANCE (AMB ONLY)    XR SHOULDER BILATERAL ARTHRITIS SERIES    XR ELBOW BILATERAL 2 VIEW    PAIN CL FL EPIDURAL STEROID INJ CERVICAL    REFERRAL TO ORTHOPAEDIC ALL [place] LOCATIONS     Ordered C7/T1 CESI with Dr. Woodie.   -Takes ASA 81 mg PO daily. Does not need to hold for injection.   Ordered bilateral shoulder X-ray to evaluate for OA.   Ordered bilateral elbow X-ray. 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   Referral placed to Ortho sports med at this time for further evaluation.   Continue following with Neurology for medication management.  Follow up after injection or sooner if needed.    Patient informed of red flags symptoms like caudal anesthesia, bowel, bladder incontinence including the inability to urinate, weakness of legs that they need to go to the ED emergently for possible impingement of the spinal nerve roots and may need urgent decompression. Patient confirmed understanding.     23 minutes of medical discussion and decision making spent with the patient via telemedicine and chart review.  Our impression, treatment recommendations, and plan from today's visit were reviewed in detail with the patient. All of the patients questions were answered and wishes to move forward with above noted plan.   The patient was seen independently with a physician available via phone if needed.    Heather DELENA Fellows, APRN, CNP, 06/12/2024       [1] No Known Allergies  [2]   Social History  Tobacco Use    Smoking status: Former     Types: Cigarettes    Smokeless tobacco: Never   Vaping Use    Vaping status: Never Used   Substance Use Topics    Alcohol use: Yes     Comment: occasionally    Drug use: Never

## 2024-06-13 ENCOUNTER — Other Ambulatory Visit: Payer: Self-pay

## 2024-06-17 ENCOUNTER — Ambulatory Visit (HOSPITAL_BASED_OUTPATIENT_CLINIC_OR_DEPARTMENT_OTHER): Admission: RE | Admit: 2024-06-17 | Discharge: 2024-06-17 | Disposition: A | Payer: MEDICAID | Source: Ambulatory Visit

## 2024-06-17 ENCOUNTER — Ambulatory Visit: Admission: RE | Admit: 2024-06-17 | Discharge: 2024-06-17 | Disposition: A | Payer: MEDICAID | Source: Ambulatory Visit

## 2024-06-17 ENCOUNTER — Other Ambulatory Visit: Payer: Self-pay

## 2024-06-17 ENCOUNTER — Other Ambulatory Visit (INDEPENDENT_AMBULATORY_CARE_PROVIDER_SITE_OTHER): Payer: Self-pay

## 2024-06-17 DIAGNOSIS — M25512 Pain in left shoulder: Secondary | ICD-10-CM

## 2024-06-17 DIAGNOSIS — M25511 Pain in right shoulder: Secondary | ICD-10-CM | POA: Insufficient documentation

## 2024-06-17 DIAGNOSIS — M19022 Primary osteoarthritis, left elbow: Secondary | ICD-10-CM

## 2024-06-17 DIAGNOSIS — M19021 Primary osteoarthritis, right elbow: Secondary | ICD-10-CM

## 2024-06-17 DIAGNOSIS — M25529 Pain in unspecified elbow: Secondary | ICD-10-CM | POA: Insufficient documentation

## 2024-06-19 ENCOUNTER — Ambulatory Visit: Payer: Self-pay | Admitting: ORTHOPAEDIC SURGERY

## 2024-07-03 ENCOUNTER — Ambulatory Visit (HOSPITAL_BASED_OUTPATIENT_CLINIC_OR_DEPARTMENT_OTHER): Payer: MEDICAID | Admitting: ORTHOPAEDIC SURGERY

## 2024-07-03 ENCOUNTER — Ambulatory Visit: Payer: MEDICAID | Admitting: ANESTHESIOLOGY

## 2030-05-15 DIAGNOSIS — M47812 Spondylosis without myelopathy or radiculopathy, cervical region: Secondary | ICD-10-CM | POA: Insufficient documentation
